# Patient Record
Sex: Male | Born: 1980 | Race: Black or African American | Hispanic: No | Marital: Single | Smoking: Never smoker
Health system: Southern US, Community
[De-identification: ages and names within clinical notes are randomized; demographics above are authoritative.]

## PROBLEM LIST (undated history)

## (undated) DIAGNOSIS — N3944 Nocturnal enuresis: Secondary | ICD-10-CM

## (undated) DIAGNOSIS — Z952 Presence of prosthetic heart valve: Secondary | ICD-10-CM

## (undated) DIAGNOSIS — G919 Hydrocephalus, unspecified: Secondary | ICD-10-CM

## (undated) DIAGNOSIS — R040 Epistaxis: Secondary | ICD-10-CM

## (undated) DIAGNOSIS — R569 Unspecified convulsions: Secondary | ICD-10-CM

## (undated) DIAGNOSIS — E871 Hypo-osmolality and hyponatremia: Secondary | ICD-10-CM

## (undated) DIAGNOSIS — N289 Disorder of kidney and ureter, unspecified: Secondary | ICD-10-CM

## (undated) DIAGNOSIS — I6509 Occlusion and stenosis of unspecified vertebral artery: Secondary | ICD-10-CM

## (undated) DIAGNOSIS — I10 Essential (primary) hypertension: Secondary | ICD-10-CM

## (undated) DIAGNOSIS — R9431 Abnormal electrocardiogram [ECG] [EKG]: Secondary | ICD-10-CM

## (undated) DIAGNOSIS — I639 Cerebral infarction, unspecified: Secondary | ICD-10-CM

## (undated) DIAGNOSIS — M329 Systemic lupus erythematosus, unspecified: Secondary | ICD-10-CM

## (undated) DIAGNOSIS — S069X9A Unspecified intracranial injury with loss of consciousness of unspecified duration, initial encounter: Secondary | ICD-10-CM

## (undated) DIAGNOSIS — Z8669 Personal history of other diseases of the nervous system and sense organs: Secondary | ICD-10-CM

## (undated) DIAGNOSIS — D332 Benign neoplasm of brain, unspecified: Secondary | ICD-10-CM

## (undated) DIAGNOSIS — K7460 Unspecified cirrhosis of liver: Secondary | ICD-10-CM

## (undated) DIAGNOSIS — I8290 Acute embolism and thrombosis of unspecified vein: Secondary | ICD-10-CM

## (undated) DIAGNOSIS — I999 Unspecified disorder of circulatory system: Secondary | ICD-10-CM

## (undated) DIAGNOSIS — Z87898 Personal history of other specified conditions: Secondary | ICD-10-CM

## (undated) DIAGNOSIS — S069XAA Unspecified intracranial injury with loss of consciousness status unknown, initial encounter: Secondary | ICD-10-CM

## (undated) DIAGNOSIS — D689 Coagulation defect, unspecified: Secondary | ICD-10-CM

## (undated) HISTORY — PX: OTHER SURGICAL HISTORY: SHX169

## (undated) HISTORY — PX: DIALYSIS FISTULA CREATION: SHX611

## (undated) HISTORY — PX: BRAIN SURGERY: SHX531

## (undated) HISTORY — PX: CHOLECYSTECTOMY: SHX55

## (undated) HISTORY — PX: CARDIAC SURGERY: SHX584

## (undated) HISTORY — PX: EYE SURGERY: SHX253

---
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
## 2000-09-18 ENCOUNTER — Encounter: Admission: RE | Admit: 2000-09-18 | Discharge: 2000-09-18 | Payer: Self-pay | Admitting: Internal Medicine

## 2000-11-08 ENCOUNTER — Encounter: Admission: RE | Admit: 2000-11-08 | Discharge: 2000-11-08 | Payer: Self-pay | Admitting: Internal Medicine

## 2000-11-26 ENCOUNTER — Encounter: Payer: Self-pay | Admitting: Emergency Medicine

## 2000-11-26 ENCOUNTER — Encounter (INDEPENDENT_AMBULATORY_CARE_PROVIDER_SITE_OTHER): Payer: Self-pay | Admitting: *Deleted

## 2000-11-26 ENCOUNTER — Inpatient Hospital Stay (HOSPITAL_COMMUNITY): Admission: EM | Admit: 2000-11-26 | Discharge: 2000-12-17 | Payer: Self-pay | Admitting: Emergency Medicine

## 2000-11-26 ENCOUNTER — Encounter: Payer: Self-pay | Admitting: Internal Medicine

## 2000-11-26 ENCOUNTER — Encounter (INDEPENDENT_AMBULATORY_CARE_PROVIDER_SITE_OTHER): Payer: Self-pay | Admitting: Specialist

## 2000-11-27 ENCOUNTER — Encounter: Payer: Self-pay | Admitting: Internal Medicine

## 2000-11-27 ENCOUNTER — Encounter: Payer: Self-pay | Admitting: Emergency Medicine

## 2000-11-29 ENCOUNTER — Encounter: Payer: Self-pay | Admitting: Internal Medicine

## 2000-12-01 ENCOUNTER — Encounter: Payer: Self-pay | Admitting: Internal Medicine

## 2000-12-02 ENCOUNTER — Encounter: Payer: Self-pay | Admitting: Internal Medicine

## 2000-12-03 ENCOUNTER — Encounter: Payer: Self-pay | Admitting: Internal Medicine

## 2000-12-07 ENCOUNTER — Encounter: Payer: Self-pay | Admitting: Internal Medicine

## 2000-12-09 ENCOUNTER — Encounter: Payer: Self-pay | Admitting: Internal Medicine

## 2000-12-11 ENCOUNTER — Encounter: Payer: Self-pay | Admitting: Nephrology

## 2000-12-11 ENCOUNTER — Encounter: Payer: Self-pay | Admitting: General Surgery

## 2000-12-12 ENCOUNTER — Encounter: Payer: Self-pay | Admitting: General Surgery

## 2000-12-16 ENCOUNTER — Encounter: Payer: Self-pay | Admitting: Internal Medicine

## 2000-12-24 ENCOUNTER — Encounter (HOSPITAL_COMMUNITY): Admission: RE | Admit: 2000-12-24 | Discharge: 2001-03-24 | Payer: Self-pay | Admitting: Nephrology

## 2000-12-25 ENCOUNTER — Encounter: Payer: Self-pay | Admitting: Internal Medicine

## 2000-12-25 ENCOUNTER — Inpatient Hospital Stay (HOSPITAL_COMMUNITY): Admission: RE | Admit: 2000-12-25 | Discharge: 2001-01-24 | Payer: Self-pay | Admitting: Internal Medicine

## 2000-12-25 ENCOUNTER — Encounter (INDEPENDENT_AMBULATORY_CARE_PROVIDER_SITE_OTHER): Payer: Self-pay | Admitting: Specialist

## 2000-12-25 ENCOUNTER — Encounter: Admission: RE | Admit: 2000-12-25 | Discharge: 2000-12-25 | Payer: Self-pay | Admitting: Internal Medicine

## 2000-12-26 ENCOUNTER — Encounter: Payer: Self-pay | Admitting: Internal Medicine

## 2000-12-31 ENCOUNTER — Encounter: Payer: Self-pay | Admitting: Internal Medicine

## 2001-01-01 ENCOUNTER — Encounter: Payer: Self-pay | Admitting: *Deleted

## 2001-01-04 ENCOUNTER — Encounter: Payer: Self-pay | Admitting: Internal Medicine

## 2001-01-08 ENCOUNTER — Encounter: Payer: Self-pay | Admitting: Internal Medicine

## 2001-01-09 ENCOUNTER — Encounter: Payer: Self-pay | Admitting: Surgery

## 2001-01-10 ENCOUNTER — Encounter: Payer: Self-pay | Admitting: Surgery

## 2001-01-11 ENCOUNTER — Encounter: Payer: Self-pay | Admitting: Cardiothoracic Surgery

## 2001-01-12 ENCOUNTER — Encounter: Payer: Self-pay | Admitting: Cardiothoracic Surgery

## 2001-01-13 ENCOUNTER — Encounter: Payer: Self-pay | Admitting: Cardiothoracic Surgery

## 2001-01-17 ENCOUNTER — Encounter: Payer: Self-pay | Admitting: Surgery

## 2001-01-19 ENCOUNTER — Encounter: Payer: Self-pay | Admitting: *Deleted

## 2001-02-05 ENCOUNTER — Encounter: Admission: RE | Admit: 2001-02-05 | Discharge: 2001-02-05 | Payer: Self-pay | Admitting: Internal Medicine

## 2001-02-20 ENCOUNTER — Inpatient Hospital Stay (HOSPITAL_COMMUNITY): Admission: EM | Admit: 2001-02-20 | Discharge: 2001-02-28 | Payer: Self-pay

## 2001-02-21 ENCOUNTER — Encounter: Payer: Self-pay | Admitting: Internal Medicine

## 2001-02-21 ENCOUNTER — Encounter: Payer: Self-pay | Admitting: Neurosurgery

## 2001-02-23 ENCOUNTER — Encounter: Payer: Self-pay | Admitting: Internal Medicine

## 2001-02-25 ENCOUNTER — Encounter: Payer: Self-pay | Admitting: Neurosurgery

## 2001-02-27 ENCOUNTER — Encounter: Payer: Self-pay | Admitting: Neurosurgery

## 2001-03-12 ENCOUNTER — Encounter: Payer: Self-pay | Admitting: Neurosurgery

## 2001-03-12 ENCOUNTER — Inpatient Hospital Stay (HOSPITAL_COMMUNITY): Admission: EM | Admit: 2001-03-12 | Discharge: 2001-03-31 | Payer: Self-pay

## 2001-03-14 ENCOUNTER — Encounter: Payer: Self-pay | Admitting: Infectious Diseases

## 2001-03-15 ENCOUNTER — Encounter: Payer: Self-pay | Admitting: Infectious Diseases

## 2001-03-15 ENCOUNTER — Encounter: Payer: Self-pay | Admitting: Neurosurgery

## 2001-03-18 ENCOUNTER — Encounter: Payer: Self-pay | Admitting: Infectious Diseases

## 2001-03-21 ENCOUNTER — Encounter: Payer: Self-pay | Admitting: Neurosurgery

## 2001-03-21 ENCOUNTER — Encounter: Payer: Self-pay | Admitting: Infectious Diseases

## 2001-03-24 ENCOUNTER — Encounter: Payer: Self-pay | Admitting: Infectious Diseases

## 2001-03-31 ENCOUNTER — Encounter: Payer: Self-pay | Admitting: Neurosurgery

## 2001-05-19 ENCOUNTER — Encounter: Admission: RE | Admit: 2001-05-19 | Discharge: 2001-05-19 | Payer: Self-pay | Admitting: Internal Medicine

## 2001-07-22 ENCOUNTER — Inpatient Hospital Stay (HOSPITAL_COMMUNITY): Admission: AC | Admit: 2001-07-22 | Discharge: 2001-08-02 | Payer: Self-pay

## 2001-07-22 ENCOUNTER — Encounter: Payer: Self-pay | Admitting: Emergency Medicine

## 2001-07-23 ENCOUNTER — Encounter: Payer: Self-pay | Admitting: Internal Medicine

## 2001-07-24 ENCOUNTER — Encounter: Payer: Self-pay | Admitting: Internal Medicine

## 2001-07-25 ENCOUNTER — Encounter: Payer: Self-pay | Admitting: Neurosurgery

## 2001-07-25 ENCOUNTER — Encounter: Payer: Self-pay | Admitting: Internal Medicine

## 2001-07-29 ENCOUNTER — Encounter: Payer: Self-pay | Admitting: Internal Medicine

## 2001-08-01 ENCOUNTER — Encounter: Payer: Self-pay | Admitting: Neurosurgery

## 2001-10-13 ENCOUNTER — Encounter: Payer: Self-pay | Admitting: Neurosurgery

## 2001-10-13 ENCOUNTER — Encounter: Admission: RE | Admit: 2001-10-13 | Discharge: 2001-10-13 | Payer: Self-pay | Admitting: Neurosurgery

## 2001-11-14 ENCOUNTER — Emergency Department (HOSPITAL_COMMUNITY): Admission: EM | Admit: 2001-11-14 | Discharge: 2001-11-14 | Payer: Self-pay | Admitting: *Deleted

## 2002-02-16 ENCOUNTER — Encounter: Admission: RE | Admit: 2002-02-16 | Discharge: 2002-02-16 | Payer: Self-pay | Admitting: Internal Medicine

## 2002-03-09 ENCOUNTER — Encounter: Admission: RE | Admit: 2002-03-09 | Discharge: 2002-03-09 | Payer: Self-pay | Admitting: Internal Medicine

## 2002-04-13 ENCOUNTER — Encounter: Admission: RE | Admit: 2002-04-13 | Discharge: 2002-04-13 | Payer: Self-pay | Admitting: Internal Medicine

## 2002-04-15 ENCOUNTER — Encounter: Admission: RE | Admit: 2002-04-15 | Discharge: 2002-04-15 | Payer: Self-pay | Admitting: Internal Medicine

## 2002-06-08 ENCOUNTER — Encounter: Admission: RE | Admit: 2002-06-08 | Discharge: 2002-06-08 | Payer: Self-pay | Admitting: Internal Medicine

## 2002-07-07 ENCOUNTER — Encounter: Admission: RE | Admit: 2002-07-07 | Discharge: 2002-07-07 | Payer: Self-pay | Admitting: Internal Medicine

## 2002-07-16 ENCOUNTER — Encounter: Admission: RE | Admit: 2002-07-16 | Discharge: 2002-07-16 | Payer: Self-pay | Admitting: Internal Medicine

## 2003-02-03 ENCOUNTER — Encounter: Admission: RE | Admit: 2003-02-03 | Discharge: 2003-02-03 | Payer: Self-pay | Admitting: Internal Medicine

## 2003-03-04 ENCOUNTER — Encounter: Admission: RE | Admit: 2003-03-04 | Discharge: 2003-03-04 | Payer: Self-pay | Admitting: Internal Medicine

## 2003-10-27 ENCOUNTER — Encounter: Admission: RE | Admit: 2003-10-27 | Discharge: 2003-10-27 | Payer: Self-pay | Admitting: Internal Medicine

## 2003-11-05 ENCOUNTER — Encounter: Admission: RE | Admit: 2003-11-05 | Discharge: 2003-11-05 | Payer: Self-pay | Admitting: Internal Medicine

## 2003-11-18 ENCOUNTER — Encounter: Admission: RE | Admit: 2003-11-18 | Discharge: 2003-11-18 | Payer: Self-pay | Admitting: Internal Medicine

## 2004-09-28 ENCOUNTER — Ambulatory Visit: Payer: Self-pay | Admitting: Internal Medicine

## 2004-09-28 ENCOUNTER — Inpatient Hospital Stay (HOSPITAL_COMMUNITY): Admission: EM | Admit: 2004-09-28 | Discharge: 2004-10-05 | Payer: Self-pay | Admitting: Emergency Medicine

## 2005-03-30 ENCOUNTER — Encounter (HOSPITAL_COMMUNITY): Admission: RE | Admit: 2005-03-30 | Discharge: 2005-06-28 | Payer: Self-pay | Admitting: Nephrology

## 2005-10-29 ENCOUNTER — Ambulatory Visit (HOSPITAL_COMMUNITY): Admission: RE | Admit: 2005-10-29 | Discharge: 2005-10-29 | Payer: Self-pay | Admitting: Neurosurgery

## 2005-11-20 ENCOUNTER — Ambulatory Visit (HOSPITAL_COMMUNITY): Admission: RE | Admit: 2005-11-20 | Discharge: 2005-11-20 | Payer: Self-pay | Admitting: Internal Medicine

## 2005-11-27 ENCOUNTER — Ambulatory Visit (HOSPITAL_COMMUNITY): Payer: Self-pay | Admitting: Psychiatry

## 2006-08-03 ENCOUNTER — Inpatient Hospital Stay (HOSPITAL_COMMUNITY): Admission: EM | Admit: 2006-08-03 | Discharge: 2006-08-05 | Payer: Self-pay | Admitting: Emergency Medicine

## 2007-03-14 ENCOUNTER — Encounter (HOSPITAL_COMMUNITY): Admission: RE | Admit: 2007-03-14 | Discharge: 2007-05-28 | Payer: Self-pay | Admitting: Nephrology

## 2008-06-09 ENCOUNTER — Emergency Department (HOSPITAL_COMMUNITY): Admission: EM | Admit: 2008-06-09 | Discharge: 2008-06-09 | Payer: Self-pay | Admitting: Emergency Medicine

## 2008-06-14 ENCOUNTER — Emergency Department (HOSPITAL_COMMUNITY): Admission: EM | Admit: 2008-06-14 | Discharge: 2008-06-14 | Payer: Self-pay | Admitting: Emergency Medicine

## 2008-07-29 ENCOUNTER — Encounter (HOSPITAL_COMMUNITY): Admission: RE | Admit: 2008-07-29 | Discharge: 2008-10-27 | Payer: Self-pay | Admitting: Nephrology

## 2008-11-10 ENCOUNTER — Emergency Department (HOSPITAL_COMMUNITY): Admission: EM | Admit: 2008-11-10 | Discharge: 2008-11-10 | Payer: Self-pay | Admitting: Emergency Medicine

## 2009-06-25 ENCOUNTER — Emergency Department (HOSPITAL_COMMUNITY): Admission: EM | Admit: 2009-06-25 | Discharge: 2009-06-25 | Payer: Self-pay | Admitting: Emergency Medicine

## 2010-07-16 ENCOUNTER — Encounter: Payer: Self-pay | Admitting: Neurosurgery

## 2010-10-04 LAB — IRON AND TIBC
Saturation Ratios: 57 % — ABNORMAL HIGH (ref 20–55)
TIBC: 205 ug/dL — ABNORMAL LOW (ref 215–435)
UIBC: 88 ug/dL

## 2010-10-05 LAB — IRON AND TIBC: Saturation Ratios: 43 % (ref 20–55)

## 2010-10-09 IMAGING — CR DG HAND COMPLETE 3+V*L*
3 series · 3 of 3 positions shown · non-contrast
Comparison: None

CLINICAL DATA: Fall.  Posterior pain and swelling.

LEFT HAND - COMPLETE 3+ VIEW

[view not recorded (1 of 3)]
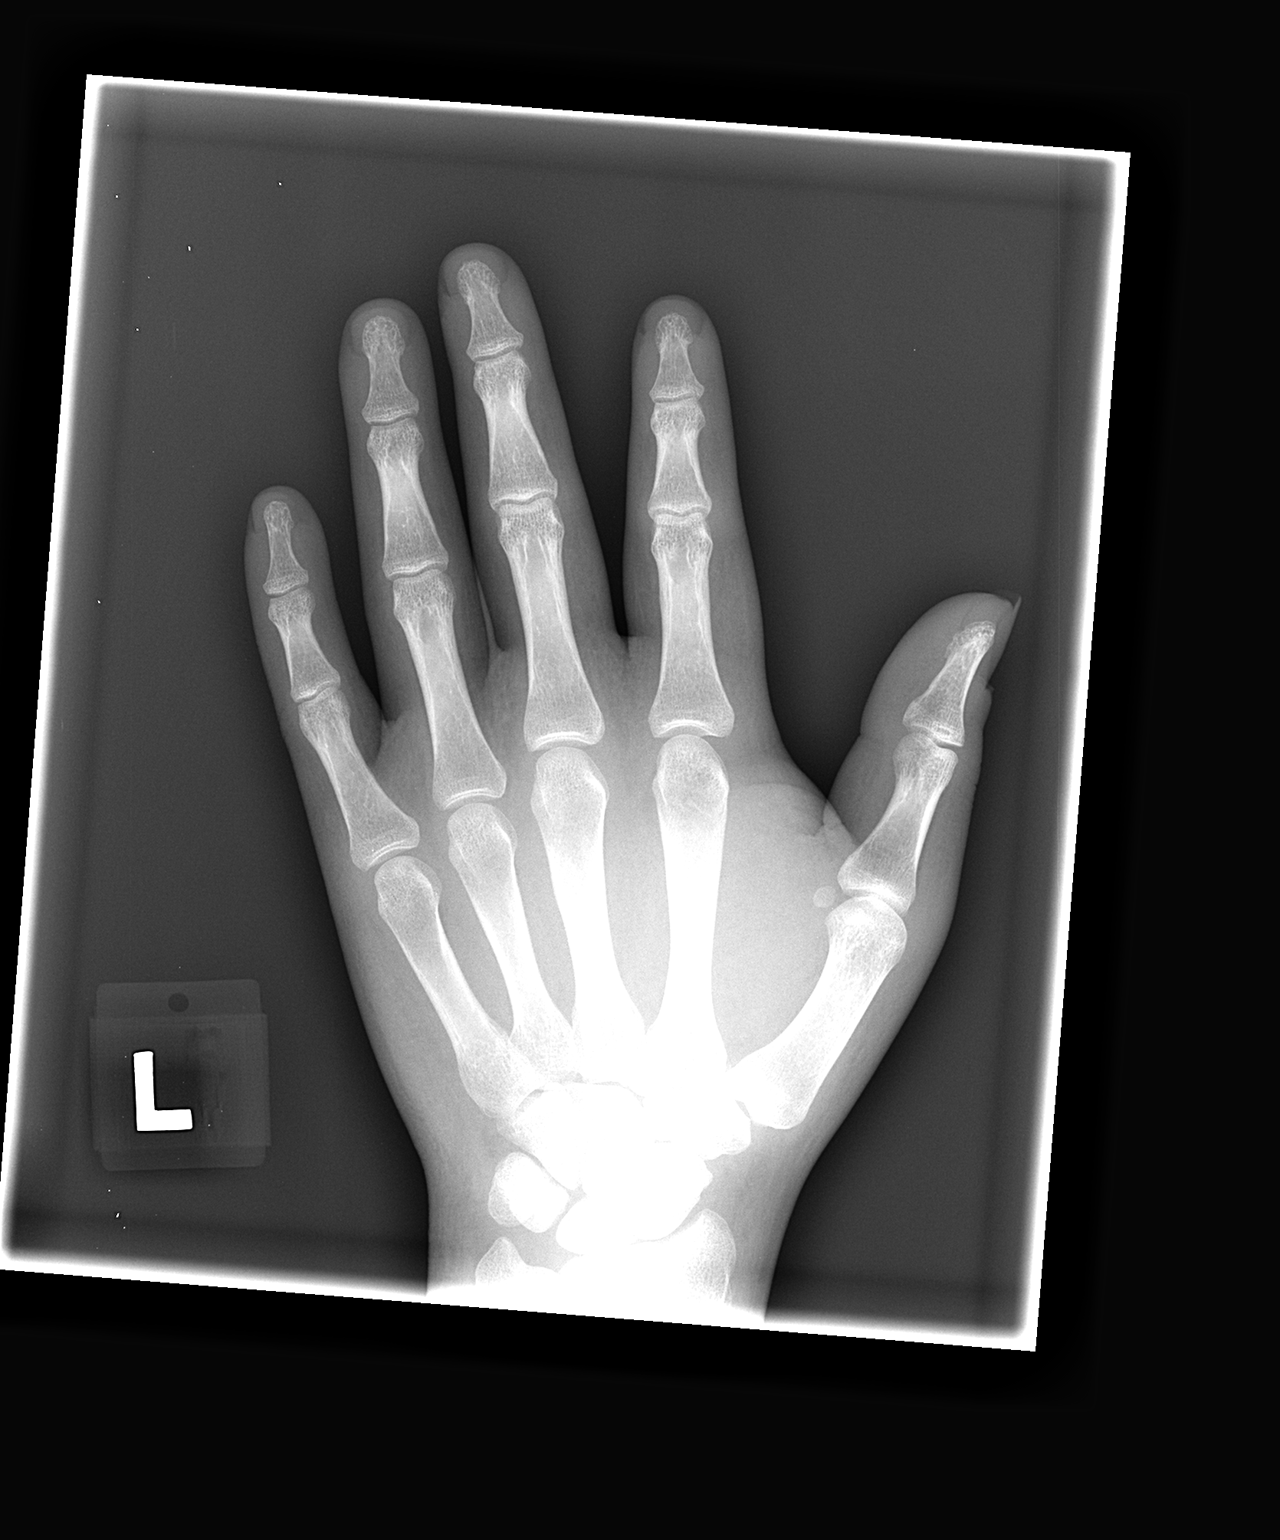

[view not recorded (2 of 3)]
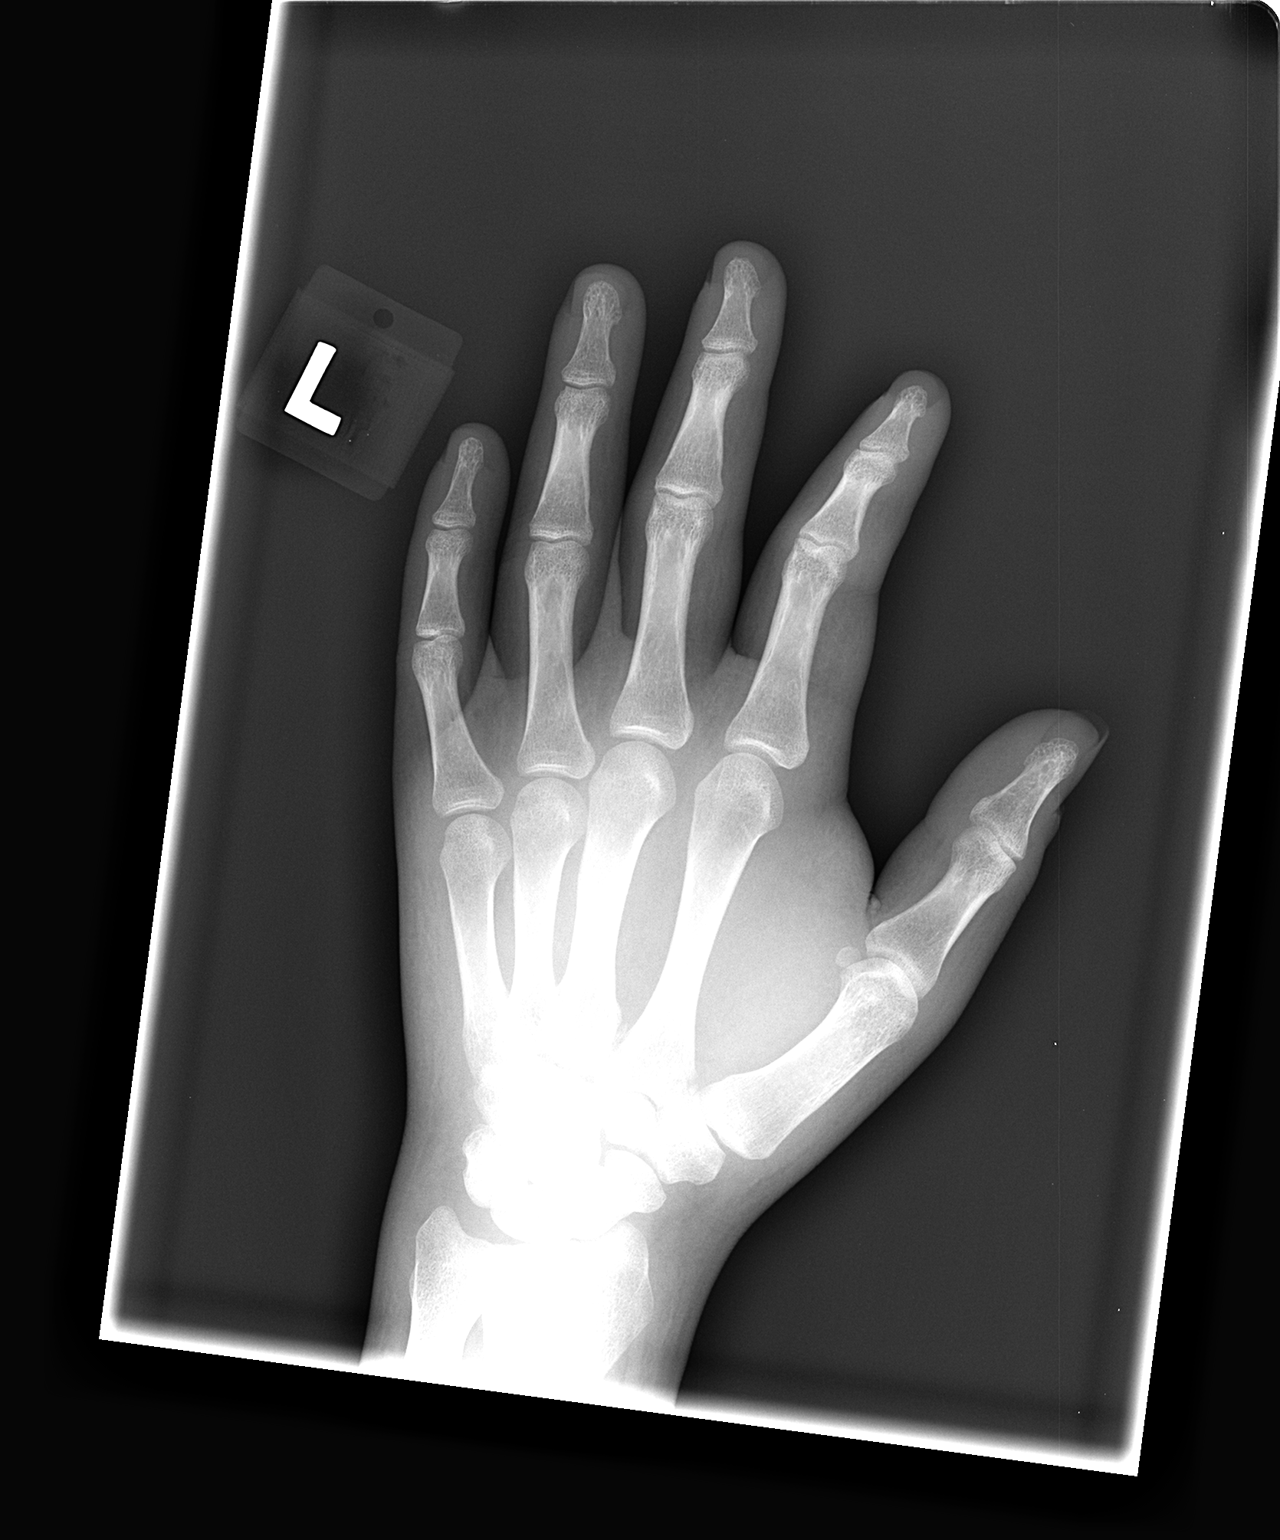

[view not recorded (3 of 3)]
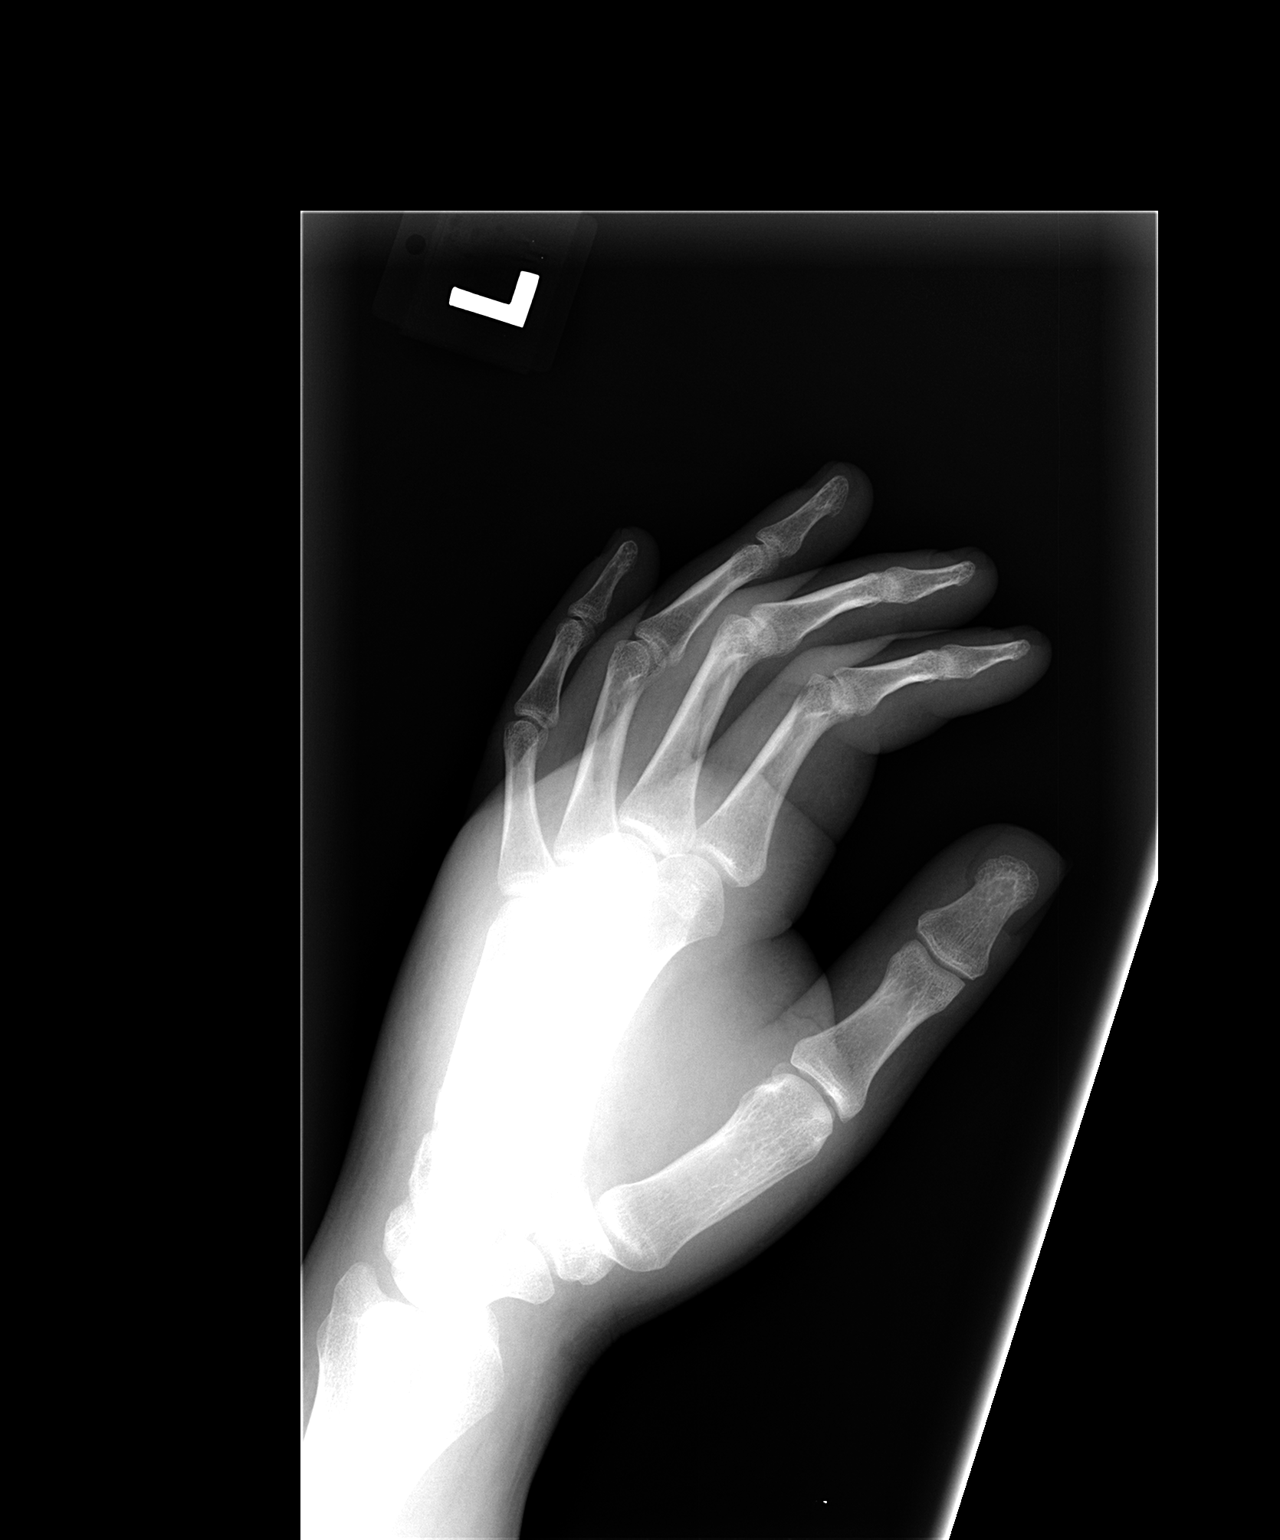

[3 of 3 positions shown; findings below may reference images not displayed]

FINDINGS: No lateral view is performed.  2 oblique images were
performed.  Given this factor, no acute fracture or dislocation.
There is soft tissue swelling about the dorsum of the hand at the
MCP level.
IMPRESSION: 1.  Lack of true lateral view limits this exam.
2.  Soft tissue swelling dorsally without acute fracture or
dislocation.

## 2010-10-10 LAB — IRON AND TIBC
Iron: 101 ug/dL (ref 42–135)
Saturation Ratios: 48 % (ref 20–55)
UIBC: 108 ug/dL

## 2010-10-10 LAB — POCT HEMOGLOBIN-HEMACUE: Hemoglobin: 11.4 g/dL — ABNORMAL LOW (ref 13.0–17.0)
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
## 2010-11-10 NOTE — Consult Note (Signed)
[place]  Patient:    Cody Simon, Cody Simon                       MRN: [id number] Proc. Date: 12/09/00 Adm. Date:  [id number] Attending:  Madaline Guthrie CC:         Madaline Guthrie, M.D.  James L. Deterding, M.D.  Clydene Fake, M.D.   Consultation Report  REQUESTING PHYSICIAN:  Madaline Guthrie, M.D.  REASON FOR CONSULTATION:  Acute cholecystitis.  HISTORY OF PRESENT ILLNESS:  Cody Simon is a 30 year old male with a significant past medical history.  He presented to the emergency department noticing increasing abdominal girth, lower extremity edema, and facial edema.  He was admitted with the diagnosis of worsening symptoms of his nephrotic syndrome. He has a known VP shunt and this was felt to be functioning.  He did have a few renal insufficiency at the time.  Echocardiogram performed demonstrated a very large pericardial effusion.  A cardiology consultation was obtained and the effusion was drained and right ventricular function has improved since that time.  Over the past two days, he has noticed increasing right upper quadrant pain and has had some nausea and vomiting.  Liver function tests were also noted to be elevated.  Ultrasound of the right upper quadrant was performed, which demonstrated a thickened gallbladder wall with sludge. Findings were consistent with acute cholecystitis.  I was asked to see him because of this.  He states that in the past, sometimes after eating, he gets a pressure-type sensation in the right upper quadrant that would resolve spontaneously.   This was sporadic.  PAST MEDICAL HISTORY: 1. Nephrotic syndrome, membranous nephropathy. 2. Systemic lupus erythematosus contributing to #1. 3. Hypertension. 4. Astrocytoma. 5. Postoperative panhyperpituitarism. 6. Chronic anemia.  PAST SURGICAL HISTORY: 1. Craniotomy and resection of astrocytoma. 2. VP shunt x 2. 3. Kidney biopsy. 4. Reconstructive surgery to his  eye.  ALLERGIES:  None reported.  CURRENT INPATIENT MEDICATIONS:  As listed on his medication sheet.  SOCIAL HISTORY:  He has graduated from high school.  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  He had lived mostly with his grandmother but then has tried to stay somewhat with his mother.  He currently lives with an aunt.  PHYSICAL EXAMINATION:  GENERAL:  A weak-appearing male, very pleasant and cooperative.  VITAL SIGNS:  He is afebrile.  HEENT:  His sclerae and conjunctivae are not icteric.  ABDOMEN:  Soft with some small upper abdominal scars.  He has right upper quadrant tenderness and guarding to palpation.  No palpable masses noted.  LABORATORY DATA:  Remarkable for elevation of his SGOT, SGPT, alkaline phosphatase and elevation of his white blood cell count which was 19,500 yesterday but is 15,500 today.  IMPRESSION:  Right upper quadrant pain with ultrasonographic and laboratory findings consistent with acute cholecystitis.  Physical exam fits this picture as well.  This is somewhat complicated by the fact that has a ventriculoperitoneal shunt which is working.  There is risk whether the gallbladder is removed operatively or medical treatment is started of infection of the shunt.  RECOMMENDATIONS:  I spoke with the patient and grandmother.  After speaking with Dr. Rogelia Boga and noting that his kidney function had improved and he had stable cardiac function on a repeat echocardiogram now, I told them I thought cholecystectomy was indicated.  I did explain to them the risks of the cholecystectomy including but not limited to bleeding, infection,  bile leakage, intestinal damage, and the risk of infection of the shunt.  I also explained to them the possibility of just trying medical therapy but that he potentially will have recurrence of the cholecystitis and/or could have infection of the shunt leading to a meningitis situation.  His grandmother would like to  speak with the primary physician, as well as the nephrologist and the cardiologist.  I will await the familys decision on this.  I did talk with Dr. Rogelia Boga about the recommendations including IV antibiotics which he was going to start. DD:  12/09/00 TD:  12/09/00 Job: [id number]

## 2010-11-10 NOTE — Consult Note (Signed)
[place]  Patient:    Cody Simon, Cody Simon                       MRN: [id number] Adm. Date:  [id number] Attending:  Farley Ly DictatorNonnie Done                          Consultation Report  DATE OF BIRTH:  [birth year]  HISTORY OF PRESENT ILLNESS:  A 29 year old African-American male with history of SLE, history of astrocytoma status post shunt with revision in fall of 2001, hypopituitarism secondary to surgery recently admitted on June 4 and had cholecystectomy and pericardial centesis done during that admission and was discharged on December 17, 2000.  Patient was admitted this time on December 25, 2000 with complaints of abdominal pain, nausea, and vomiting.  Patient was found to have spontaneous subcapsular liver hematoma and pericardial effusion. Drainage of the pericardial effusion has been held off by the cardiologist until he is clinically stable.  Patient had acute renal failure during this hospitalization with oliguria and increased creatinine which is resolved now. Patient has had history of hypertension and has been treated with Catapres patch, labetalol IV p.r.n. during this hospitalization and is on labetalol drip at present.  Patient was given an ACE inhibitor for a short while and was discontinued later.  Renal consult was obtained for management of hypertension.  PAST MEDICAL HISTORY: 1. ______ astrocytoma in August 1991 status post surgery and multiple shunts.    Last shunt was in fall of 2001. 2. 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 3. SLE with history of nephrotic syndrome.  Kidney biopsy in June 2001    revealed ______ membranous and diffuse lupus glomerulonephritis class 5    and 4 B. 4. Status post cholecystectomy in June 2002. 5. Status post pericardial centesis in June 2002 secondary to SLE. 6. Iron deficiency anemia.  MEDICATIONS: 1. Catapres 0.3 mg q.24h. patch q.7 days.  Patient was started on a second    patch  today. 2. Labetalol IV 5.5 mcg/minute. 3. Lasix 20 mg IV given today. 4. Morphine. 5. Dulcolax. 6. Phenergan. 7. Tylenol.  SOCIAL HISTORY:  Patient lives in [place] with his aunt.  He is a Engineer, agricultural.  ALLERGIES:  NKDA.  REVIEW OF SYSTEMS:  Patient denies fever, chills.  Patient complains of pain in his abdomen which is currently controlled with medications.  No chest pain. No shortness of breath.  Patient had been constipated over the past few days and had two bowel movements yesterday.  NEUROLOGIC:  No weakness, paraesthesias, seizures.  PHYSICAL EXAMINATION  GENERAL:  Patient is sleepy, in no acute distress.  Patient is awake, alert, responds to commands.  VITAL SIGNS:  Temperature 96.8, pulse 80, blood pressure 160-190/100-120, pulse ox 98% on room air.  Is and Os 1670/1100.  There is a balance of +570 over the past eight hours.  HEENT:  Normocephalic, atraumatic.  Pupils are equal, round and reactive to light and accommodation.  No conjunctival pallor.  Mucous membranes are moist.  LUNGS:  Clear to auscultation bilaterally.  HEART:  Regular rate and rhythm.  No murmurs, rubs, or gallops.  ABDOMEN:  Tenderness present in the right upper quadrant and left upper quadrant.  No rebound, tenderness.  Bowel sounds are present.  EXTREMITIES:  No edema.  LABORATORIES:  Sodium 120, repeat sodium 126, potassium 3.6, chloride 92, bicarbonate 22, BUN 29,  creatinine 1.2, glucose 375, bilirubin 3.1.  Urine sodium 52.  Bleeding time 15.  Urine bilirubin is negative.  UA showed 0-2 wbcs, 0-2 rbcs, many bacteria.  Culture is Gram negative rods Enterococcus. DIC panel showed platelets 139,000, PT 12.7, INR 0.9, PTT 32, fibrinogen 524, D-dimer of less than 0.22.  ASSESSMENT AND PLAN: 1. Hypertension probably secondary to renal parenchymal disease.  Patient is    on clonidine patch 0.3 mg q.hour and currently has two patches of clonidine    and labetalol drip at 5.5  mcg.  Will start Norvasc.  Will discontinue NG    and start Norvasc 10 mg p.o. and follow.  Will continue labetalol drip for    now.  Will change to p.o. labetalol in the next one to two days when he is    able to tolerate p.o.  Will continue Lasix 20 mg intravenous t.i.d. 2. Lupus glomerulonephritis.  Patient is on steroids. 3. Hyponatremia.  Patient is currently on D5 intravenous fluids.  Will change    medications and administer the medications with normal saline and follow    ______. 4. Liver hematoma followed by surgery. 5. Pericardial effusion followed by cardiology.  Plan on pericardial centesis    when patient is more stable. DD:  01/01/01 TD:  01/02/01 Job: [id number]

## 2010-11-10 NOTE — Discharge Summary (Signed)
NAMEJERRION, TABBERT                ACCOUNT NO.:  [id number]   MEDICAL RECORD NO.:  [id number]          PATIENT TYPE:  INP   LOCATION:  5032                         FACILITY:  [facility]   PHYSICIAN:  Ellie Lunch, M.D.      DATE OF BIRTH:  05-Feb-1981   DATE OF ADMISSION:  09/27/2004  DATE OF DISCHARGE:  10/05/2004                                 DISCHARGE SUMMARY   ADDENDUM:  Please note that the patient's free Dilantin level is pending on  the day of discharge and the levels came back on April 11 and they were less  than 1.  His restaurant was called and his Dilantin dose was increased from  100 mg p.o. b.i.d. to 100 p.o. t.i.d. starting April 20.  He has been  scheduled to come to the outpatient clinic for a Dilantin level as well as  an albumin level on October 18, 2004 to titrate his dose accordingly.      BP/MEDQ  D:  10/12/2004  T:  10/12/2004  Job:  [id number]   cc:   Fayrene Fearing L. Deterding, M.D.  [address]  [place]  [place] [address]  Fax: [phone]

## 2010-11-10 NOTE — H&P (Signed)
NAMEMARWIN, Cody Simon                ACCOUNT NO.:  [id number]   MEDICAL RECORD NO.:  [id number]          PATIENT TYPE:  EMS   LOCATION:  MAJO                         FACILITY:  [facility]   PHYSICIAN:  Mobolaji B. Bakare, M.D.DATE OF BIRTH:  [birth year]   DATE OF ADMISSION:  08/02/2006  DATE OF DISCHARGE:                              HISTORY & PHYSICAL   PRIMARY CARE PHYSICIAN:  Dr. Felecia Shelling at [place].   NEPHROLOGIST:  Dr. Darrick Penna.   CHIEF COMPLAINT:  Low hemoglobin and high Dilantin level.   HISTORY OF THE PRESENTING COMPLAINT:  Cody Simon is an unfortunate 30-year-  old African American male with history of lupus, pineal astrocytoma, is  status post radiation treatment and VP shunt.  He has a seizure disorder  and chronic kidney disease.  He is being followed up by [employer].  The patient went for his routine followup today and some  labs were requested.  Results came back showing severe anemia with  hemoglobin of 5.5 and hematocrit 16.1.  Previous hemoglobin and  hematocrit on the 12th of December 2007 were 11.2/33.3.  The patient at  that point had stopped Aranesp.  There is no history of melenic stool or  hematemesis.  There is not much history obtainable from the patient  because he was drowsy and unable to give adequate history.  The patient  does have severe behavioral problems with some mental retardation.  He  lives in a group home setting.   In addition, he has Dilantin level of 30.5.  His creatinine had  deteriorated from 2.75 in December 2007 to 4.1 today.   The patient is currently receiving 1 unit of packed red blood cells.   REVIEW OF SYSTEMS:  Unobtainable.   PAST MEDICAL HISTORY:  1. 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  2. Severe hypertension.  3. Seizure disorder.  4. Pineal astrocytoma, status post radiation therapy and VP shunt.  5. Pericardial effusion secondary to lupus, status post      pericardiocentesis in June 2002.  6.  Secondary hyperparathyroidism.  7. Behavioral problems with mental retardation.  8. Organic brain syndrome.  9. Multiple revisions of VP shunt.  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  11.Cholecystectomy.  12.Hypothyroidism.   CURRENT MEDICATIONS:  1. Prednisone 10 mg daily.  2. Calcitriol 0.5 mcg two tablets daily.  3. Lisinopril 20 mg at bedtime.  4. Omeprazole.  5. Renovite once a day.  6. Dilantin 300 mg in a.m. and 200 mg in p.m.  7. Minoxidil 2.5 mg daily (THIS WAS DISCONTINUED TODAY).  8. Synthroid 0.5 mg daily.  9. Lasix 80 mg b.i.d. (THIS WAS DISCONTINUED TODAY).  10.Hydrocodone.  11.CellCept 500 mg b.i.d.  12.Tylenol p.r.n.  13.Invega 6 mg daily.  14.Monistat cream b.i.d.  15.Rozerem 8 mg nightly p.r.n.   ALLERGIES:  No known drug allergies.   FAMILY HISTORY:  Unobtainable.   SOCIAL HISTORY:  The patient resides in a group home.   PHYSICAL EXAMINATION:  INITIAL VITALS:  Temperature 98.5, blood pressure  104/59, pulse of 100, respiratory rate of 18, O2 SATs of 97%.  GENERAL:  The patient is  drowsy, falls back to sleep easily.  HEENT:  Normocephalic.  Pupils are equal and reactive to light.  Mucous  membranes moist.  NECK:  No elevated JVD.  No carotid bruit.  Not that the patient has a  surgical scar in the cervical region.  LUNGS:  Clear clinically to auscultation.  CV:  S1 and S2, regular, with short systolic murmur.  ABDOMEN:  Not distended, soft, nontender.  Bowel sounds present.  RECTAL:  No melenic stool noted, stool sent for Hemoccult.  EXTREMITIES:  No pedal edema or calf tenderness.  Dorsalis pedis pulses  are palpable.  CNS:  No focal neurological deficits.   LABORATORY DATA:  Occult blood negative.  Creatinine 4.1.  Sodium 124,  potassium 5.3, chloride 97, glucose 97, BUN 106, bicarb 19.5.  Hemoglobin 6.5, hematocrit 19.  Venous pH 7.31.  Dilantin level 44.6.  White cell count 6.4, platelets 292,000, MCV 94.8.   ASSESSMENT AND PLAN:  Cody Simon is a 30 year old  African American male  presenting with severe anemia and Dilantin toxicity.  He has  deterioration in the chronic kidney disease with creatinine of 4.1.  The  patient has a history of lupus nephritis.   ADMISSION DIAGNOSES:  1. Severe anemia:  This is probably related to chronic kidney disease;      however, the acuity of change in hemoglobin is concerning for blood      loss, but there is no obvious source of bleeding.  Stool Hemoccult      is negative.  The patient will be transfused gently with 2 units of      packed red blood cells and reassessed thereafter.  Anemia panel      will be checked.  2. Chronic kidney disease:  There is rapid deterioration over the last      2 months.  We will obtain Nephrology consult in the morning.  Will      treat mild hyperkalemia with Kayexalate 15 mg p.o. x1 now, check      urinalysis and microscopy, obtain renal panel in the morning.  3. Systemic lupus:  We will continue CellCept and prednisone.  I      suspect he may have a flare-up of lupus activity.  We will check      sedimentation rate, C-reactive protein, CH50, C3 and C4.  4. Secondary hyperparathyroidism.  5. Dilantin toxicity:  We will hold Dilantin and monitor level.  6. Seizure disorder:  We need to adjust renal-dose Dilantin when      Dilantin level normalizes.  7. Altered mental status:  The patient is too drowsy.  He does have a      ventriculoperitoneal shunt.  We will check head CT scan to rule out      hydrocephalus.  The altered mentation may be due to sedating      medications.  Patient to be off Rozerem.  8. Panhypopituitarism:  We will check thyroid-stimulating hormone,      resume Synthroid.  9. Hyponatremia.      Mobolaji B. Corky Downs, M.D.  Electronically Signed     MBB/MEDQ  D:  08/03/2006  T:  08/03/2006  Job:  [id number]   cc:   Dr. Luberta Mutter D. Felecia Shelling, MD

## 2010-11-10 NOTE — Discharge Summary (Signed)
[place]  Patient:    MEHTAB, DOLBERRY Visit Number: [id number] MRN: [id number]          Service Type: MED Location: [phone] Attending Physician:  Madaline Guthrie Dictated by:   Dory Peru, Admit Date:  03/12/2001 Discharge Date: 03/31/2001                             Discharge Summary  ADDENDUM  FOLLOWUP OF PENDING LABORATORIES:  Plasma catecholamines were unable to be quantitated due to an interfering substance in the patients blood.  However, urine metanephrine levels showed a normal level of metanephrine.  Urine 24 hour volume was 1675, metanephrine was 104, reference range 30 to 350. Normetanephrine was 111, reference range 50 to 650.  Creatinine was 45.  Urine creatinine was 754.  ______ acid BMA was 1.5, with reference range of 0 to 7.0.  Urine 5HA was 2, with a reference range of 0 to 15.2. Dictated by:   Dory Peru, Attending Physician:  Madaline Guthrie DD:  04/11/01 TD:  04/14/01 Job: [id number]

## 2010-11-10 NOTE — Op Note (Signed)
. [facility]  Patient:    Cody Simon, Cody Simon                       MRN: [id number] Proc. Date: 01/09/01 Adm. Date:  [id number] Attending:  Cleatrice Burke                           Operative Report  PREOPERATIVE DIAGNOSIS:  Recurrent pericarditis with large pericardial effusion.  POSTOPERATIVE DIAGNOSIS:  Recurrent pericarditis with large pericardial effusion.  PROCEDURES:  Median sternotomy, radical pericardiectomy.  SURGEON:  Alleen Borne, M.D.  ASSISTANTS:  Areta Haber, P.A., and Sherrie George, P.A.  ANESTHESIA:  General endotracheal.  CLINICAL HISTORY:  This patient is a 30 year old gentleman with a history of lupus, who has had recurrent pericarditis.  On the recent admission prior to this admission, he was diagnosed with a large pericardial effusion that was treated with pericardiocentesis by cardiology.  He also was diagnosed with acute cholecystitis and underwent a laparoscopic cholecystectomy.  This was complicated by postoperative bleeding.  He went home but was readmitted with several days of nausea, vomiting, and poor appetite as well as abdominal pain. CT scan of the abdomen showed a large subcapsular liver hematoma.  This also showed a large pericardial effusion.  He was noted on physical examination to have a pulsus paradoxus of 40-44 mmHg.  He remained hemodynamically stable. We were consulted to see him.  Since he was admitted with large subcapsular liver hematoma, we thought it would be best to allow him to recover from that and be sure that that is stable before proceeding with drainage of his pericardial effusion.  Given his history of lupus and recurrent pericarditis and pericardial effusion and the appearance that this was an organized effusion on CT scan, we felt that it would be best to perform complete pericardiectomy to prevent recurrence and complications of constrictive pericarditis.  I discussed the  operative procedure with the patient and his aunt, including alternatives, benefits, and risks, including bleeding, possible blood transfusion, infection, injury to the heart or great vessels, recurrence of the effusion, and death.  They understood and agreed to proceed.  DESCRIPTION OF PROCEDURE:  The patient was taken to the operating room and placed on the table in supine position.  After induction of general endotracheal anesthesia, a Foley catheter was placed in the bladder using sterile technique.  Then the chest, abdomen, and both groins were prepped with Betadine soap and solution and draped in the usual sterile manner.  The chest was entered through a median sternotomy incision.  The pericardium was opened in the midline.  There was a marked fibrinous pericarditis with organization of the large pericardial effusion.  Both the visceral and parietal pericardium were markedly thickened and adherent to each other and to the heart.  There was no free fluid or blood present within the pericardium.  Then the parietal pericardium was completely freed from the heart.  This took a considerable amount of time due to the density of the adhesions.  Then the visceral pericardium was completely removed from the heart.  This was very thick and acutely inflamed.  There was no injury to the heart and minimal bleeding. Then the parietal pericardium was excised laterally down to the phrenic nerve on each side.  The diaphragmatic pericardium was excised down to the inferior vena cava and posteriorly to the pulmonary vein.  The phrenic nerves were  carefully preserved.  There was complete hemostasis.  Then four chest tubes were placed with bilateral pleural tubes, a tube inferiorly over the diaphragm within the mediastinum and one in the anterior mediastinum.  Then the sternum was reapproximated with #6 stainless steel wires.  The fascia was closed wit a continuous #1 Vicryl suture.  The subcutaneous  tissue was closed with a continuous 2-0 Vicryl and the skin with 3-0 Vicryl subcuticular skin closure. The sponge, needle, and instruments were correct according to the scrub nurse. Dry sterile dressings were applied over the incision and around the chest tubes, which were hooked to Pleuravac suction.  The patient was then transported to the surgical intensive care unit in guarded but stable condition. DD:  01/09/01 TD:  01/10/01 Job: [id number]

## 2010-11-10 NOTE — Consult Note (Signed)
[place]  Patient:    Cody Simon, Cody Simon                       MRN: [id number] Proc. Date: 01/20/01 Adm. Date:  [id number] Attending:  Cleatrice Burke CC:         Farley Ly, M.D.   Consultation Report  HISTORY OF PRESENT ILLNESS:  Cody Simon is a 30 year old right-handed black male born [birth year] with a history of lupus who presents following a cholecystectomy.  The patient was admitted with a known pericardial effusion status post pericardiocentesis.  Patient was noted to have some nausea, vomiting, abdominal pain.  Workup during this hospitalization showed evidence of a pericapsular hemorrhage around the liver.  Patient was followed by general surgery and cardiology during this admission.  This patient was noted to have an enlarging pericardial effusion.  This was surgically managed through CVTS during this admission.  Patient, however, was noted to have episodes of altered mental status with a question of seizures occurring. Patient was noted to have hard tremors and would not respond well to the examiner.  Since that time patient has been in and out of episodes of unresponsiveness without jerking.  A CT scan of the head was performed and shows evidence of a VP shunt in place.  There is evidence of a low density in the posterior fossa on the left consistent with an encephalomalacia but no acute changes were seen.  Patient has been placed on Dilantin.  Continues to have episodes of unresponsiveness.  Neurology was called for an evaluation.  PAST MEDICAL HISTORY:  1. Episodes of altered mental status, rule out seizures.  Patient has a prior     history of seizures around the time of a diagnosis of a ______     astrocytoma.  2. ______ astrocytoma resected August 1991.  Patient has been off seizure     medications since 1993, has done well.  3. History of pericarditis and pericardial effusion status post pericardial  stripping.  4. History of lupus.  5. History of hypertension.  6. History of VP shunt placement.  7. History of nephrotic syndrome.  8. History of hypopituitarism.  9. Status post gallbladder resection. 10. Capsular hematoma of the liver following gallbladder resection.  ALLERGIES:  No known drug allergies.  SOCIAL HISTORY:  Does not smoke or drink.  This patient lives in the [place] area.  Patient lives with family.  Patient has no children. Patient is not employed.  CURRENT MEDICATIONS:  1. Dulcolax 10 mg q.d.  2. Norvasc 10 mg q.d.  3. Protonix 40 mg q.d.  4. Synthroid 0.1 mg q.d.  5. Aldactone 25 mg q.d.  6. Ferrous gluconate 300 mg b.i.d.  7. Cardura 4 mg b.i.d.  8. Prednisone 60 mg q.d.  9. Labetalol 600 mg b.i.d. 10. Erythropoietin 10,000 units monthly. 11. Lasix 80 mg q.12h. 12. Dilantin 200 mg b.i.d. 13. Tylox p.r.n. 14. Phenergan p.r.n. 15. Tylenol p.r.n.  REVIEW OF SYSTEMS:  Difficult to obtain but patient appears to complain of some dizziness, headaches off and on.  Denies any visual changes.  Has some occasional nausea, vomiting, abdominal pain.  Denies any numbness or weakness in the arms or legs.  FAMILY HISTORY:  Not obtained.  PHYSICAL EXAMINATION  VITAL SIGNS:  Blood pressure 125/75, heart rate 95, respiratory rate 17, temperature 99.2.  GENERAL:  This patient is a fairly well-developed black male who is alert  at the time of examination.  HEENT:  Head is atraumatic.  Eyes:  Pupils are equal, round and reactive to light.  Disks are flat bilaterally.  NECK:  Supple no carotid bruits noted.  RESPIRATORY:  Clear.  CARDIOVASCULAR:  Distant heart sounds.  No obvious murmur noted.  EXTREMITIES:  Without significant edema.  NEUROLOGIC:  Cranial nerves as above.  Facial symmetry is present.  Patient notes good pin prick sensation bilaterally.  Visual fields are full.  Speech is well enunciated.  Patient has good strength in all four extremities.   Good symmetric motor tone is noted throughout.  Sensory testing is intact to pin prick, soft touch, vibratory sensation throughout.  Patient has good finger-nose-finger, toe-to-finger bilaterally.  Patient was not ambulated. Deep tendon reflexes were symmetric.  Toes neutral bilaterally.  LABORATORIES:  Notable for white count 13.0, hemoglobin 9.2, hematocrit 26.9, platelets 187,000.  Sodium 135, potassium 3.9, chloride 101, CO2 26, glucose 125, BUN 34, creatinine 1.1, albumin 2.1, calcium 7.4, phosphorous 3.8, magnesium 1.4.  Dilantin 14.6.  CT of the head is as above.  IMPRESSION: 1. History of lupus. 2. History of pericardial effusion status post pericardial stripping. 3. History of seizures with possible recurrence. 4. History of hypertension.  Patient has episodes of unresponsiveness that could potentially represent seizures.  Occasionally some left facial twitching is noted but these events could be psychogenic as well.  Will pursue further workup at this point.  PLAN: 1. EEG study. 2. Continue Dilantin for now.  Would obtain free and total levels.  Consider    an MRI scan of the brain at some point in the future if possible and will    follow patients clinical examination. DD:  01/20/01 TD:  01/20/01 Job: [id number]

## 2010-11-10 NOTE — Consult Note (Signed)
[place]  Patient:    Cody Simon, Cody Simon                       MRN: [id number] Proc. Date: 11/29/00 Adm. Date:  [id number] Attending:  Madaline Guthrie CC:         Madaline Guthrie, M.D.   Consultation Report  REASON FOR CONSULTATION:  Evaluation of ventriculoperitoneal shunt.  HISTORY OF PRESENT ILLNESS:  The patient is a 30 year old gentleman who presented June 4 with progressive edema.  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  He was found to have a fluid overload with pulmonary edema and even a pericardial effusion, which he underwent pericardiocentesis.  He has a history of a pineal astrocytoma resected from the posterior fossa in 1991 with subsequent VP shunt placement at [facility].  He had a shunt revision at [facility] in 2001 and the question is if some of this nausea and vomiting that he had been having over the last few days and intermittent headache is due to his multiple medical problems or to VP shunt failure.  He complains of no headache now and has done better with nausea and vomiting today.  He kept some food down, which is better than yesterday, according to his grandmother.  PAST MEDICAL HISTORY:  As above; lupus, hydrocephalus, and nephrotic syndrome.  HOME MEDICATIONS: 1. Cortef. 2. Synthroid.  ALLERGIES:  No known drug allergies.  PAST SURGICAL HISTORY:  As mentioned above.  REVIEW OF SYSTEMS:  He has moved in with his grandmother here in [place] back at the end of January and since then has been lethargic and lack of energy.  He has some intermittent headaches but Tylenol usually makes them go away and he has not had any major headache problems and has not changed mental status throughout this period of time.  He has been having shortness of breath, which is part of the reason for his admission.  SOCIAL HISTORY:  He did graduate from high school.  He lives with  his grandmother.  PHYSICAL EXAMINATION:  GENERAL:  The patient is resting comfortably in bed, eyes closed.  He is easily arousable to voice.  HEENT:  General edematous condition with swelling of his eyelids.  It gives him appearance of ptosis.  A shunt was palpated.  It pumped and filled very easily.  We were able to occlude the proximal portion and pump and the bubble stayed depressed.  When we unplugged the proximal portion, it filled very quickly.  This is consistent with a well-working shunt.  Because of the size of the ventricles on the CT, I would not recommend pumping the shunt very often, as that may actually plug it.  NECK:  Supple, nontender.  NEUROLOGIC:  He is easily arousable.  Oriented x 3.  He is able to follow commands easily.  Pupils are reactive.  Extraocular movements are intact. Tongue is midline.  Face is symmetric.  Pharynx increases symmetrically.  He moves arms and legs appropriately to command.  Gait not tested.  LABORATORY DATA:  CT scan of the head done November 26, 2000 was evaluated, showing extremely small ventricles.  There are some postop changes, especially at the left posterior fossa.  There is a right posterior ventricular catheter that goes across his midline and this in the left lateral ventricle.  There are no comparison films but, according to an old report from 1999, the size of the ventricular system sounds  like it is stable.  ASSESSMENT AND PLAN:  This is a difficult case, as in any shunt evaluation, and not having any baseline information also makes this more difficult.  In this case, mainly because the shunt fills well and because the CT shows extremely small ventricles, I do not think shunt failure is a problem here.  I would recommend getting a shunt series for completeness of his shunt workup and to get baseline.  I also would repeat CT for any neurologic changes.  We can compare that to the films of June 4.  Also, I will be getting a  report, including the operative report.  Discharge summaries and copies of the CT reports from [facility] might also be helpful in the shunt evaluation.  We will follow over the weekend and especially follow up on the shunt series. DD:  11/29/00 TD:  11/30/00 Job: [id number]

## 2010-11-10 NOTE — Op Note (Signed)
. [facility]  Patient:    Cody Simon, Cody Simon                       MRN: [id number] Proc. Date: 12/09/00 Adm. Date:  [id number] Attending:  Madaline Guthrie                           Operative Report  PREOPERATIVE DIAGNOSIS:  Acute cholecystitis.  POSTOPERATIVE DIAGNOSIS:  Subacute cholecystitis.  OPERATION PERFORMED:  Laparoscopic cholecystectomy with intraoperative cholangiogram.  SURGEON:  Adolph Pollack, M.D.  ANESTHESIA:  General.  INDICATIONS FOR PROCEDURE:  This 30 year old male was admitted for medical reasons as he has multiple medical problems.  Over the past two days he developed progressively increasing right upper quadrant pain with elevation of liver function tests.  Gallbladder ultrasound demonstrated gallbladder sludge with thickened gallbladder wall.  Examination showed classic Eulah Pont sign in the right upper quadrant.  He does have a ventriculoperitoneal shunt present. He now presents for laparoscopic cholecystectomy and possible ventriculoperitoneal shunt removal.  DESCRIPTION OF PROCEDURE:  He was placed supine on the operating table and general anesthetic was administered.  His abdomen was sterilely prepped and draped.  A small subumbilical incision was made incising the skin sharply.  A midline fascia was identified and a small incision made in the midline fascia. The peritoneal cavity was entered sharply under direct vision.  Clear serous fluid was evacuated from the peritoneal cavity.  A pursestring suture of 0 Vicryl suture was placed around the fascial edges.  A Hasson trocar was introduced into the peritoneal cavity and pneumoperitoneum created by insufflation of CO2 gas.  Next the laparoscope was introduced.  The gallbladder appeared to be pale and somewhat inflamed although it was not gangrenous.  There was clear serous fluid present throughout the abdominal cavity.  Under direct vision, an 11 mm trocar was placed  through a similar sized incision in the epigastrium and two 5 mm trocars placed in the right abdomen.  The ascitic fluid was evacuated. Approximately 400 to 500 cc was evacuated.  The gallbladder fundus was grasped and adhesions between the omentum and duodenum and gallbladder were taken down sharply and bluntly.  The gallbladder was somewhat intrahepatic.  I then grasped the infundibulum and began dissecting it, completely mobilized the infundibulum exposing cystic duct gallbladder junction. There was an anterior branch of the cystic artery which was adherent to the cystic duct and I clipped and divided this.  I then made a small incision in the cystic duct and passed a cholangiocatheter through the abdominal wall into the cystic duct.  A cholangiogram was then performed.  Using real time fluoroscopy, cholangiography was performed.  I did not notice any filling defects in the common bile duct.  I noted the common hepatic and right and left hepatic ducts.  There was a long small cystic duct noted.  The cholangiocatheter was removed.  The cystic duct was then clipped three or four times proximally.  It was divided.  I examined the clips and some did not appear to come flush.  I pulled two off easily.  The other two were fairly firm.  I then placed an Endoloop on the cystic duct.  Next, I dissected out the cystic artery, clipped it and divided it.  This was a posterior branch of the cystic artery.  The gallbladder was then dissected free from the liver bed.  There was a very small  perforation made in the gallbladder with minimal spillage of bile as the  perforation was immediately sealed with a clamp.  It was then placed in an Endopouch bag.  The Endopouch bag was removed through the subumbilical port.  The subumbilical trocar was reinserted.  Next, I used 3L of saline to copiously irrigate out the perihepatic area and the abdominal cavity.  Bleeding points were controlled with the  cautery.  All blood clots were  removed.  The ventriculoperitoneal shunt was noted in the right upper quadrant and was not disturbed.  I made sure that all the fluid and the clot was evacuated around the shunt.  Next, I closed the fascial defect of the subumbilical incision with interrupted Vicryl sutures.  I then removed the trocars and released the pneumoperitoneum.  The fascial defect of the epigastric incision was closed with a single 0 Vicryl suture.  The skin incisions were closed with 4-0 Monocryl subcuticular stitches followed by Steri-Strips and sterile dressings.  The patient tolerated the procedure fairly well without any apparent intraoperative complications.  He was taken to the recovery room in satisfactory condition.  Intraoperatively, I talked with Dr. Phoebe Perch.  Postoperatively, we will continue to maintain him on intravenous antibiotics.  I did not see an indication to exteriorize the shunt at the time. DD:  12/09/00 TD:  12/10/00 Job: [id number]

## 2010-11-10 NOTE — Discharge Summary (Signed)
[place]. [facility]  Patient:    Cody Simon, Cody Simon                       MRN: [id number] Adm. Date:  [id number] Attending:  Madaline Guthrie Dictator:   Blanch Media, M.D. CC:         Cody Simon, M.D. (please fax)   Discharge Summary  DATE OF BIRTH:  May 02, 1981  CONSULTANTS:  1. [employer].  2. Dr. Adolph Simon.  3. Dr. Clydene Simon.  4. Dr. Helene Simon.  DISCHARGE DIAGNOSES:  1. Systemic lupus erythematosus nephritis.     a. 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     b. Twenty-four-hour urine protein 627 mg on December 03, 2000.     c. Maintained on oral prednisone.     d. ANA greater than 12,000, double-stranded DNA 1:160.  2. Pericardial tamponade.     a. Status post pericardiocentesis on November 27, 2000 with removal of one        liter of hemorrhagic pericardial fluid.     b. Simultaneous placement of a Swan-Ganz catheter with measurements        consistent with pericardial tamponade.     c. Post pericardiocentesis followup echocardiogram:  Resolution of the        pericardial fluid with no reaccumulation.     d. Repeat 2-D echocardiogram on December 16, 2000 secondary to new heart rub,        results pending at time of dictation.  3. Anemia, normocytic.     a. Likely iron deficient with a ferritin level of 993, a percent        saturation of 12%, iron of 20 and a total iron binding capacity of 172.     b. No evidence of hemolysis.     c. Required transfusion of packed red blood cells, two units on admission        and two units on December 12, 2000.     d. Hemoglobin trending down on December 16, 2000.  CT of the abdomen and        pelvis to rule out retroperitoneal bleed pending.     e. Abdominal ultrasound performed post renal biopsy negative for renal        hematoma.  4.  Hypertension.     a. Being controlled on labetalol and Norvasc.     b. ACE relatively contraindicated at this time as caused an elevation in        serum creatinine.  5. Acute cholecystitis.     a. Right upper quadrant ultrasound on December 09, 2000 showed gallbladder        sludge and wall thickening.     b. Alkaline phosphatase 182, total bilirubin 0.4, AST 52, ALT 92 on        December 09, 2000.     c. Status post laparoscopic cholecystectomy on December 09, 2000.     d. HIDA scan on December 11, 2000 showed no bile leakage.  6. Postoperative ileus.     a. Resolved with bowel rest and decreased narcotics.  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     b. Now para-hypopituitary.  Had received  growth hormone and testosterone        and Cortef, but now only receiving Synthroid.     c. 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     d. Shunt revision at [facility], 2001.  8. Organic brain syndrome.     a. Limited learning but graduated high school.     b. Previous presentation of nephrotic syndrome in July of 1999.     c. 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  BRIEF ADMITTING HISTORY:  [name] is a 30 year old black male who complained of facial edema, increased abdominal girth and lower extremity edema starting November 23, 2000.  On the morning of admission, the facial edema was severe enough to cause his right eye to completely close.  Patient says he has had mild fevers but had not checked his temperature.  He complains of chest pain with cough which is nonproductive.  He has had some shortness of breath and decreased energy.  He has had nausea and vomiting and diarrhea, although he denies abdominal pain or anorexia.  He has had some headache and oliguria compared to baseline.  ADMITTING MEDICATIONS:  1. Cortef 10 mg t.i.d.  2. Synthroid 0.5 mg p.o. q.d.  ADMITTING PHYSICAL EXAMINATION:  Temperature 97.1,  respiratory rate 20, heart rate 86, blood pressure 171/125, 91% on room air.  GENERAL:  This is an alert male in no acute distress who was very conversive and did not appear toxic. HEENT:  Positive facial edema, especially periorbital.  Extraocular muscles intact.  HEART:  Regular rate and rhythm.  No murmurs, rubs, or gallops. LUNGS:  Crackles, bibasilar, with good air movement.  ABDOMEN:  Positive bowel sounds.  Positive tenderness in the right upper quadrant.  Voluntary guarding. EXTREMITIES:  No edema.  DP pulses 1+/2.  NEUROLOGIC:  Nonfocal exam.  LABORATORY DATA:  Sodium 131, potassium 5.0, chloride 105, CO2 23, BUN 31, creatinine 2.1, glucose 81, AST 26, ALT 19, alkaline phosphatase 165, total bilirubin 0.6, albumin 1.9.  WBC 6.9, hemoglobin 5.7, platelets 253,000, RDW 19.8, MCV 86.7.  HOSPITAL COURSE: #1 - SLE NEPHRITIS:  This was confirmed with an ANA level and a double-stranded DNA level.  Nephrology was consulted early in the hospital course and started [name] on prednisone.  His serum creatinine peaked at a level of 2.4 on the 9th.  It trended down on steroids after that.  Patient was taken to have a repeat renal biopsy to see if his pathology had progressed compared to his previous biopsy results.  His renal biopsy was completed without any complication.  When preliminary results were obtained, it was decided not to start him on Cytoxan as he was responding to the prednisone and he was not an ideal candidate for cytotoxic therapy.   Patients serum creatinine trended down until he developed acute cholecystitis, when it started to rise again.  He had also been started on an ACE inhibitor just prior to his episode of acute cholecystitis, which could have also contributed to his increase in serum creatinine.  Patient was taken off his ACE inhibitor and as his acute cholecystitis resolved and he was no longer n.p.o. and per renal, his creatinine has trended down, and on the  day of dictation, the 24th of June, it is at level of 2.2.  His last creatinine prior to institution of  an ACE and his acute cholecystitis was a level of 1.3 on the 17th.  [name]  is being arranged to see a rheumatologist as an outpatient.  We had intended that he would be discharged from the hospital, but as he is still in the hospital, we will arrange transportation to Dr. Jeanine Luz office.  This is to get assistance in managing his extrarenal complications of lupus nephritis and assist with any additional treatment options.  #2 - PERICARDIAL TAMPONADE:  Cardiology was consulted and a 2-D echocardiogram was completed, diagnosing and confirming his pericardial tamponade.  Patient was taken emergently to the cath lab where he had a pericardiocentesis without a window placement and also had a Swan-Ganz catheter placed.  Prior to his pericardiocentesis, patients echo showed beginning of a right atrial collapse.  Post pericardiocentesis, the patient did well and a repeat echo showed no re-collection of the pericardial fluid.  On the 24th of June, patient had a pericardial rub which had not been present since his initial diagnosis at the beginning of the month.  A repeat 2-D echocardiogram was obtained and those results are pending at the time of this dictation.  #3 - HYPERTENSION:  Patient had an adverse effect to an ACE inhibitor with his serum creatinine increasing.  As of now, the patient is being maintained on labetalol and Norvasc.  These will need to be continued to be adjusted as an outpatient for maximum blood pressure control.  At time of discharge, the patients blood pressure has been running 150/90s.  #4 - ACUTE CHOLECYSTITIS:  Patient complained on admission of right upper quadrant pain and had slight elevation in his LFTs; however, other issues were ______  emergent and his abdominal pain resolved spontaneously.  On the morning of the 16th, the patient had significant right  upper quadrant pain.  A comprehensive metabolic panel was obtained which showed elevation in his enzymes.  His right upper quadrant ultrasound confirmed acute cholecystitis and surgery was consulted.  After obtaining consent from his grandmother, the patient went to the operating room for a laparoscopic cholecystectomy.  There was concern with patients VP shunt, that it might have to be externalized and neurosurgery was made aware of the condition and agreed to come in on the surgery if needed; however, thankfully, his shunt did not need to be externalized.  Patient was continued on broad-spectrum antibiotics for a total of eight days and was afebrile during the course.  Patient initially did well after surgery but had increased pain and nausea and vomiting on the 19th of June.  A HIDA scan was obtained to rule out bile leakage, which was negative. Abdominal series was obtained which did show the presence of postop ileus. Patient was put on bowel rest and had an NG and his ileus resolved spontaneously.  Patient was taking solid food without any discomfort beginning on the 20th.  #5 - ANEMIA:  Patient was admitted with a significant anemia with his hemoglobin at 5.7.  This was confirmed prior to transfusion of two units on the day of admission.  Iron studies were obtained which confirmed iron deficiency anemia.  Patient was maintained on EPO and iron throughout his hospital stay.  Patients hemoglobin began to drop and he required a repeat transfusion on the 20th of June.  Following his transfusion, his hemoglobin trended down from 9.4 after the two units to 9.1 to 8.8 to 8.5 to 7.9.  A CT of the abdomen and pelvis was obtained to rule out retroperitoneal bleed.  At the time of this dictation, it is pending.  Patient will likely be transfused an  additional two units prior to discharge.  Patient will not be discharged until he is stable and the cause of his drop in hemoglobin has  been determined.  #6 - SOCIAL ISSUES:  The patient had lived with his grandmother, who took excellent care of him, until she was admitted to an assisted living facility. 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  Patient has been evaluated to have a mental capacity of a 13- to 31 year old.  There was uncertainty in where the patient would be placed after his discharge.  As of now, the patient is going to be going home with his grandmother who can take care of him for two weeks.  After that time, it is unknown where the patient will go.  ADS has been notified and will be following up with [name].  An addendum to this discharge will be dictated on the date of his discharge. It will list discharge medications, the test results still pending and followup instructions.  This dictation was dictated early so that his hospital course would be summarized for consultants.  DD:  12/16/00 TD:  12/16/00 Job: [id number]
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
## 2010-11-10 NOTE — Consult Note (Signed)
[place]. [facility]  Patient:    Cody Simon, Cody Simon                       MRN: [id number] Proc. Date: 11/28/00 Adm. Date:  [id number] Attending:  Madaline Guthrie CC:         Madaline Guthrie, M.D.                          Consultation Report  REASON FOR CONSULTATION:  Renal insufficiency and nephrotic syndrome.  HISTORY OF PRESENT ILLNESS:  This is a 30 year old gentleman who presented on June 4 with progressive edema, abdominal swelling of at least 3 to 4 days, and his aunt says this was more.  He has a history of a pineal astrocytoma in 1991 with resection at [facility].  He had resulting quadriparesis as well as hydrocephalus.  He has a VP shunt.  In 1999, he presented with nephrotic syndrome and edema.  He had a renal biopsy done at [facility] which showed membranous lupus by the pediatric nephrology division there.  He was treated with steroids.  He now has progressive edema, decreased energy, PND, nocturia x 4 to 5.  No rash but chills.  No nausea, vomiting, diarrhea.  He has some arthralgias in his knees.  No sores in his mouth.  He has some chest pain with respiratory effort.  He denies knowing that he has had fevers.  He has very limited understanding of many questions.  He was admitted to house staff service last evening.  MEDICATIONS AT HOME: 1. Cortef 10 mg b.i.d. to t.i.d. 2. Synthroid 0.075 mg a day.  PAST MEDICAL HISTORY:   History of organic brain syndrome.  He has lupus.  He has had a high titer positive anti-DNA.  He was last seen at [facility] last year.  ALLERGIES:  No known drug allergies.  OPERATIONS:  As listed above.  REVIEW OF SYSTEMS:  HEENT: He says he has had some headaches but cannot describe them.  Cardiovascular: He gets short of breath with minimal exertion. His aunt says he does not get around the house much, but she knows he has been a little more dyspneic.  He has also had some PND.  He has severe edema all over.   GI: No real  specific symptoms.  Appetite has been good.  No nausea, vomiting, diarrhea.  Pulmonary: He has had a cough with some whitish phlegm. Skin: Negative.  Musculoskeletal: Arthritis in the knees only.  Neurological: He has had really not a lot of change in mental status from baseline.  He is just not getting around much and is a little weaker than usual.  SOCIAL HISTORY:  He did graduate from high school.  He lives with his aunt. He has had a dysfunctional family situation.  His mother did have rheumatoid arthritis.  OBJECTIVE:  GENERAL: This is a gentleman who is a very difficult historian.  He has a very puffy face.  He is extremely edematous.  He is slow to answer and has limited understanding.  HEENT:  Some exudate.  His disk margins are sharp.  VITAL SIGNS:  Blood pressure 182/130, lying down paradox at 26 to 28.  NECK:  Unremarkable with VP shunt which is felt.  CARDIOVASCULAR:  S4, grade 2/6 holosystolic murmur at the apex.  PMI is 13 cm lateral mid sternal line at fifth intercostal space.  Left ventricular lift. Cannot hear a rub.  He has 3 to 4+ edema.  Pulses 2+/4+.  LUNGS:  Decreased breath sounds.  No rales, rhonchi, or wheezes.  ABDOMEN:  Positive bowel sounds.  Liver is down 4 cm.  Positive spleen tip.  SKIN:  Dry, no rash.  No evidence of inflammatory arthritis in any of the joint.  LABORATORY DATA:  From review revealed initial hemoglobin of 5.9, white count 6900, platelets 263,000.  Sodium 131, potassium 5, chloride 105, bicarbonate 23, creatinine 2.1, BUN 31, glucose 81, albumin 1.9, bilirubin 0.6. Urinalysis positive hemoglobin, 300 mg% protein, 17 red cells, 0 to 3 white cells.  Chest x-ray: Large heart in a water-bottle fashion.  Some mild interstitial edema.  ASSESSMENT: 1. Proteinuria and edema consistent with nephrotic syndrome presumably    secondary to systemic lupus erythematosus and has ______ ______at his back    because this is also consistent  with lupus.  Very concerned that he has    had an acute nephrotic picture with his hypertension.  He also has    evidence of pericardial tamponade by clinical exam and now by    echocardiogram with some collapse.  His initial glomerular filtration    rate (GFR) was only about 40. Need to get his old records to find out    where he was three years ago when his creatinine was 0.3 to 0.4.    Worrisome issue is the pericardial disease at the current time and the    severe hypertension.  Before we lower his blood pressure, we need to get    his pericardium tapped.  Cardiology input is essential.  He probably    needs dialysis.  He needs diuresis, but cannot do this without this.  If    his creatinine is changed, he needs renal biopsy to see if he has    changed pathology.  I suspect he probably has.  The blood pressure is    consistent with acute episode. I suspect he has lupus and pericarditis    also.  He needs equivalent of prednisone 60 mg a day in the interim. 2. Anemia.  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  Need to work up that original blood needs steroids. 3. Pineal tumor and hydrocephalus.  Need to make sure his shunt is working as    this would aggravate his hypertension. 4. Mental retardation. 5. Spasticity. 6. Smoking. 7. Hypertension as above.  PLAN: 1. Cardiology to tap effusion. 2. Steroids. 3. Swan-Ganz. 4. Needs haptoglobin. 5. Twenty-four urine. 6. C3-C4. 7. Anti-DNA. 8. Lupus anticoagulase. 9. Get his old records. DD:  11/28/00 TD:  11/28/00 Job: [id number]

## 2010-11-10 NOTE — Cardiovascular Report (Signed)
[place]. [facility]  Patient:    Cody Simon, Cody Simon Visit Number: [id number] MRN: [id number]          Service Type: Attending:  Lennette Bihari, M.D. Dictated by:   Lennette Bihari, M.D. Proc. Date: 11/27/00   CC:         [facility] Govern, Office                        Cardiac Catheterization  PROCEDURE:  Swan-Ganz catheterization, right femoral arterial line and pericardiocentesis.  INDICATIONS:  [name] is a 30 year old black male who I was asked to see in cardiology consultation by the teaching service for evaluation of possible pericardial effusion.  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  He subsequently developed pain and hypopituitarism and was on steroid replacement therapy.  He has a history of systemic lupus erythematosus as well as the nephrotic syndrome and recently was documented to have 3770 mg of proteinuria.  He presented to the emergency room today with increasing swelling and edema.  On examination, blood pressure was 200/70 but there was a suggestion of pulsus paradox.  Chest x-ray showed a water bottle cardiac configuration highly suggestive of a large pleural effusion.  An echocardiogram was obtained which confirmed the significant effusion.  Due to potential impending tamponade hemodynamics, the patient was now brought to the cardiac catheterization laboratory to undergo pericardiocentesis with Swan-Ganz catheterization and right arterial line access.  DESCRIPTION OF PROCEDURE:  After premedication with Valium, the patient was prepped and draped in the usual fashion.  The right femoral vein was punctured and a venous sheath was inserted without difficulty.  A Swan-Ganz catheter was advanced under fluoroscopic guidance to the RA, RV, pulmonary artery, and pulmonary capillary wedge position.  In addition, the right femoral artery was punctured and an  arterial sheath was inserted for documentation of arterial pressure.  Attention was then directed in performing the pericardiocentesis. The subxiphoid region was prepped and draped in a sterile fashion.  This was initially difficult due to the patients inability to lie flat and his abdominal panniculus.  Ultimately, with the assistance of Dr. Alanda Amass, the pericardiocentesis was successful and approximately one liter of hemorrhagic pericardial fluid was removed.  The fluid was sent for CBC, cell ______ , protein, LDH, glucose, TB, ANA, LE prep, aerobic as well as anaerobic cultures.  This was also done under echo guidance.  The patient tolerated the procedure well.  The pigtail catheter in the pericardium was hooked up to a Hemovac for continued ongoing dwelling and removal.  HEMODYNAMIC DATA:  At the start of the procedure, right atrial pressure was 15.  Right ventricular pressure was 40/15.  Pulmonary artery pressure was 40/20 with a mean of 32.  Pulmonary capillary wedge pressure mean was 17. Cardiac output by the thermodilution method was 4.4, by the Fick method was 6.9 liters per minute.  Cardiac index was 2.4 and 3.8 liters per minute per m sq, respectively.  O2 saturation was 93% in the artery and was 63% in a pulmonary artery.  Central aortic pressure was 240/140.  The pressure varied with respiration to 195/130 consistent with a 30-45 mm paradox.  After the pericardiocentesis, the patients blood pressure was still markedly elevated at 230/150.  He was given 20 mg of IV labetalol plus started on a 2-mm per minute drip.  There was complete resolution of  his pulsus paradox down to 15 mm.  The echo guidance confirmed a significant improvement in the previously noted very large pleural effusion.  There was no further evidence for RV collapse but a persistent effusion was still present posterolaterally.  Before the tap, arterial blood gas was 7.29, CO2 36, O2 67, and O2  saturation of 91%.  The patient tolerated the procedure well and felt significantly improved following the procedure. Dictated by:   Lennette Bihari, M.D. Attending:  Lennette Bihari, M.D. DD:  06/11/01 TD:  06/11/01 Job: [id number]

## 2010-11-10 NOTE — Op Note (Signed)
[place]. [facility]  Patient:    Cody Simon, Cody Simon Visit Number: [id number] MRN: [id number]          Service Type: MED Location: [phone] Attending Physician:  Levy Sjogren Dictated by:   Tanya Nones. Jeral Fruit, M.D. Proc. Date: 03/13/01 Admit Date:  03/12/2001                             Operative Report  no dictation. Dictated by:   Tanya Nones. Jeral Fruit, M.D. Attending Physician:  Levy Sjogren DD:  03/13/01 TD:  03/14/01 Job: [id number]
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
## 2010-11-10 NOTE — Op Note (Signed)
[place]. [facility]  Patient:    LOVE, MILBOURNE                       MRN: [id number] Proc. Date: 12/03/00 Adm. Date:  [id number] Attending:  Madaline Guthrie                           Operative Report  PROCEDURE:  Left percutaneous renal biopsy.  REASON FOR PROCEDURE:  Hematuria, renal insufficiency in the setting of lupus.  DESCRIPTION OF PROCEDURE:  The risks of the procedure were explained to the patient and his grandmother and specifically a 1 in 10 chance of gross hematuria, 1 in 100 chance of requiring blood transfusion, 1 in 1000 chance of requiring surgical intervention to stop bleeding, and 1 in 5000 chance of nephrectomy.  Both the patient and the grandmother understood the risks and were willing to proceed with the procedure.  Informed consent was obtained by the grandmother.  The patient was placed in the prone position and the left flank was prepped and draped in the usual sterile fashion.  Local anesthesia was obtained with 10 cc of 1% lidocaine.  Under direct ultrasound guidance, a 15 gauge ASAP biopsy needle was placed in the inferior pole of the left kidney on three separate occasions with return of three cores of tissue.  The patient tolerated the procedure well. DD:  12/03/00 TD:  12/03/00 Job: [id number]

## 2010-11-10 NOTE — Discharge Summary (Signed)
[place]. [facility]  Patient:    Cody Simon, Cody Simon Visit Number: [id number] MRN: [id number]          Service Type: MED Location: [phone] Attending Physician:  Madaline Guthrie Dictated by:   Marisue Brooklyn, M.D. Admit Date:  03/12/2001 Discharge Date: 03/31/2001   CC:         Dr. __________  Dineen Kid. Reche Dixon, M.D., primary care [place]   Discharge Summary  Dictated by Arlys John ___________.  DATE OF BIRTH:  1981-03-25.  PATIENTS HOME ADDRESS:  [address] [place], [place], [address].  TELEPHONE NUMBER:  [phone].  SERVICE:  Internal Medicine Teaching Service.  DISCHARGE DIAGNOSES: 1. Hydrocephalus. 2. Ventriculoperitoneal shunt failure. 3. Hypertension.  DISCHARGE MEDICATIONS: 1. Levothyroxine 50 mcg p.o. once a day. 2. Pantoprazole 40 mg p.o. q.d. 3. Prednisone 10 mg p.o. q.d. 4. Phenytoin 200 mg p.o. q.h.s. 5. Labetalol 500 mg p.o. b.i.d. 6. Clonidine patch 0.2 mg two by transdermal patch q. week. 7. Hydrochlorothiazide 25 mg p.o. q.d.  DISPOSITION:  Home.  CONDITION ON DISCHARGE:  Stable.  FOLLOW-UP:  Appointment with Arlys John __________, April 10, 2001, at 2:45, at the [facility] at [facility].  HOSPITAL COURSE:  The patient is a 30 year old African-American male admitted for obtundation.  The patient had a right VP shunt placed several weeks earlier. The patients CT scan showed hydrocephalus.  The patient was taken to surgery by Tanya Nones. Jeral Fruit, M.D., and a second VP shunt was placed.  Over the next several days, the VP shunt was revised and eventually internalized. At discharge, the patients hydrocephalus had resolved and the patient was stable on home medications. At the time of admission, the patient was unresponsive.  At discharge, the patient had returned to his baseline neurologic examination.  LABS DURING HOSPITALIZATION:  At admission, CMP showed white blood cells  7.7, hemoglobin 11.3, hematocrit 33.4, platelet count 360; neutrophils 78%, lymphocytes 12%, monocytes 10%, eosinophils 0, and basophils 0.  PT 12.9, INR 1.0, PTT 33.  March 14, 2001, amylase 183.  Urinalysis on March 27, 2001, showed specific gravity of 1.01, pH 7, negative glucose, bilirubin negative, ketones negative, small amount of blood, protein 100 mg/dL, urobilinogen 0.2 mg/dL, nitrates negative, leukocytes negative.  A 24-hour urine catacholamine obtained on March 28, 2001, was unable to be quantitated due to interfering substances in the patient.  A 24-hour urine sodium showed 179 mmol/24 hours. A urine sodium showed 107 mmol/liter.  Urine total volume was 1.675 liters. Urine protein was 159 mg/dL.  A 24-hour urine protein was 2.663 g/24 hours. On March 28, 2001, a TSH showed 3.029.  RADIOLOGY:  March 12, 2001, CT of the head showed hydrocephalus. March 12, 2001, chest x-ray was normal.  March 14, 2001, CT of the head showed a new periventricular bleed with blood extending into the occipital horns of both lateral ventricles. The left shunt was noted. Hydrocephalus was still present.  March 15, 2001, right shunt was noted. No change in blood within the third ventricle observed the previous day. Continued hydrocephalus.  March 15, 2001, chest x-ray showed endotracheal tube satisfactorily placed.  Left lower extremity atelectasis. March 18, 2001, chest x-ray showed left lower lobe atelectasis improved. March 21, 2001, CT of the head without contrast showed the right frontal shunt tube has been removed with a small amount of parenchymal hemorrhaged demonstrated within the right frontal lobe at the site of the previously demonstrated tube.  Small bone fragments are  also demonstrated in that region. There has been no significant change in the small amount of parenchymal hemorrhage surrounding the proximal portion of the left frontal shunt tube. The  right parietal shunt tube is unchanged.  There has been interval decompression of the lateral and third ventricles with normal size ventricles currently demonstrated.  A cavum septum pellucidum is noted.  There is significantly less blood within the third ventricle.  The remaining blood within the ventricle measures 3.1 x 0.7 cm in maximal dimensions.  There is also less blood in the occipital horns of the lateral ventricles with a small amount of residual blood remaining bilaterally.  Mild diffuse enlargement of the subarachnoid spaces is demonstrated.  The visualized portions of the paranasal sinuses are normally pneumatized.  Encephalomalacia in the posterior inferior portions of the cerebellar hemisphere is stable. There has been no significant change in ___________ enlargement of the adjacent ventricles. The previously demonstrated intraventricular air has resolved.  March 21, 2001, a PICC line was placed successfully.  March 24, 2001, CT of the head showed no hydrocephalus.  Decreasing amounts of blood in the third ventricles and frontal lobes.  March 31, 2001, CT of the head showed decreasing amount of hemorrhage within the third ventricle and in the right frontal lobe. Otherwise no significant changes from CT scan on March 21, 2001.  No hydrocephalus.  PROCEDURES: 1. March 13, 2001, the patient had a left ventricular peritoneal shunt    placed. 2. March 17, 2001, the patient the distal end of the left VP shunt    externalized. 3. March 28, 2001, left VP shunt internalized.  CONSULTATIONS: 1. Neurosurgery. 2. General surgery.  HISTORY AND PHYSICAL:  The patient was a 30 year old who presented with hydrocephalus. The patient required placement of a left VP shunt.  After VP shunt placement, hydrocephalus resolved.  The patient also developed hypertension during his hospitalization.  The patients hypertension was  controlled medically.  HOSPITAL  COURSE:  #1 -  HYDROCEPHALUS:  The patients hydrocephalus has resolved and is currently not a problem.  Left and right VP shunts appear to be working normally.  #2 - HYPERTENSION: The patient had several episodes of hypertension during his hospitalization.  The patients blood pressure at admission was in the 120s/80s.  After VP shunt administration and revision, the patients blood pressure became elevated into the 200s/100s.  The patients blood pressure was controlled on medication.  Etiology of hypertension is unknown.  The patient has had similar episodes of elevated blood pressure on past admissions.  Of note, previous admission in July, the patient had elevated blood pressures. This should be followed up on and would recommend evaluating for conditions causing secondary hypertension including pheochromocytoma, renal vascular stenosis, other endocrinologic disorders, and kidney disease.  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  The patients UAs have shown 100 mg/dL of protein.  In addition, 24-hour urine protein showed increased levels of protein.  #3 - DEPRESSION: The patient has had numerous serious medical problems in his life.  The patient has at times had a flat affect and sad mood.  The patient would likely benefit from an evaluation for depression and considering initiation of medical antidepressant.  DISCHARGE LABS:  White blood cells 8.2, hemoglobin 9.9, hematocrit 28.7, platelet count 326, MCV 84.8.  Sodium 133, potassium 4.2, chloride 99, CO2 26, glucose 87, BUN 13, creatinine 1.1, calcium 8.7.  Urine sodium 107, 24-hour urine sodium 179, 24-hour urine output 1.675 liters, urine protein 159,  24-hour urine protein 2663.  The patients vital signs at discharge showed temperature 98.5, blood pressure stable at 138/80, pulse of 102, 98% on room air for O2 sat. Dictated by:   Marisue Brooklyn, M.D. Attending Physician:  Madaline Guthrie DD:  04/02/01 TD:   04/02/01 Job: [id number]

## 2010-11-10 NOTE — Op Note (Signed)
[place]. [facility]  Patient:    Cody Simon, Cody Simon Visit Number: [id number] MRN: [id number]          Service Type: MED Location: [phone] Attending Physician:  Levy Sjogren Dictated by:   Tanya Nones. Jeral Fruit, M.D. Proc. Date: 03/13/01 Admit Date:  03/12/2001                             Operative Report  PREOPERATIVE DIAGNOSIS:  Nonobstructive hydrocephalus, failure of right ventriculoperitoneal shunt.  POSTOPERATIVE DIAGNOSIS:  Nonobstructive hydrocephalus, failure of right ventriculoperitoneal shunt.  PROCEDURE:  Left ventriculoperitoneal shunt, ______ pressure of 93.  SURGEON:  Tanya Nones. Jeral Fruit, M.D.  ASSISTANT:  Hewitt Shorts, M.D.  CLINICAL HISTORY:  Mr. Gombos is a 30 year old gentleman who was admitted to [facility] a few weeks ago after the right shunt was not working.  It seemed that this shunt was inserted at [facility].  He has a history of brain tumor, though many years ago.  In the emergency room, I tapped his shunt and eventually he was admitted to the ICU and the shunt continued to work and he was discharged.  Yesterday, the family brought him in the afternoon because he was unconscious for at least 10 hours.  I drained the shunt and the patient woke up slightly.  CT scan showed hydrocephalus.  Because of that, we decided to proceed with a new shunt in the left side.  The decision was not to remove the old one on the right side.  The grandmother, who is the caretaker, knows about the risks such as infection, the shunt not working, need for revision of the shunt and also because of the history of infection in the abdomen and the possibility of damage and bleeding into the abdominal cavity and damage to the bowel.  DESCRIPTION OF PROCEDURE:  The patient was taken to the OR and the left side of the head was prepped with Betadine.  Prior to that, the head was shaved. Then with Betadine, we prepped the abdomen,  chest wall, neck and head.  Drapes were applied.  Incision in the left side in the frontal area away from the midline and inferior to the coronal suture was made.  A hole was drilled and the dura mater was coagulated.  Then the shunt was brought underneath the scalp down to the postauricular area.  From then on, it was brought and a shunt passer was inserted all the way to the abdominal cavity.  In the abdominal cavity, we made an incision in the left upper quadrant through the skin and through the muscle layer.  It was difficult to differentiated the peritoneum.  We found quite a bit of adhesions between the omentum and the abdominal wall.  Lysis was accomplished and we were able to see the abdominal cavity.  Investigation showed that indeed there were no bowel loops in the area of adhesion.  Then, the catheter was inserted into the abdominal cavity. The catheter remained in the left upper quadrant because secondary to the adhesions, it was difficult to push it distally; nevertheless, it was working nicely before it was inserted.  This procedure was done after we introduced the catheter into the ventricular cavity.  The shunt was put together using the connectors which were attached to the tube using silk.  At the end, we had a shunt that was working nicely.  Then, the scalp at the  neck was closed with Vicryl and staples and in the abdominal cavity, we closed the wound with Vicryl and Steri-Strips.  The patient is going back to the intensive care unit. Dictated by:   Tanya Nones. Jeral Fruit, M.D. Attending Physician:  Levy Sjogren DD:  03/13/01 TD:  03/14/01 Job: [id number]

## 2010-11-10 NOTE — Discharge Summary (Signed)
. [facility]  Patient:    Cody Simon, Cody Simon Visit Number: [id number] MRN: [id number]          Service Type: MED Location: [phone] Attending Physician:  Levy Sjogren Dictated by:   Harvest Dark, M.S. IV Admit Date:  02/20/2001 Discharge Date: 02/28/2001                             Discharge Summary  DISCHARGE DIAGNOSES: 1. Altered mental status secondary to ventriculoperitoneal shunt dysfunction. 2. Elevated white blood count. 3. Systemic lupus with pericarditis and glomerulonephritis. 4. 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 5. Pericardial tamponade in July 2002. 6. Normocytic anemia with a baseline hemoglobin of 9.5. 7. 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 8. Status post laparoscopic cholecystectomy in June 2002, with a complication    of hepatic hematoma. 9. Hypertension.  DISCHARGE MEDICATIONS: 1. Lasix 80 mg p.o. q.d. 2. Labetalol 300 mg b.i.d. 3. Dilantin 100 mg b.i.d. 4. Ferrous gluconate 325 mg p.o. b.i.d. 5. Aldactone 25 mg q.d. 6. Synthroid 100 mcg q.d. 7. Prednisone 10 mg q.d. 8. Norvasc 10 mg q.d. 9. Cardura 4 mg b.i.d.  DISPOSITION AND FOLLOWUP:  The patient is to follow up in the [facility] within one to two weeks following discharge.  He will be examined for altered mental status, as well as ventricular pressure.  PROCEDURES PERFORMED: 1. Aspiration of cerebrospinal fluid on 02/20/01. 2. Placement of PICC line on 02/23/01. 3. CT scan of head without contrast on 02/21/01. 4. CT scan of head without contrast on 02/25/01.  CONSULTATIONS:  Tanya Nones. Jeral Fruit, M.D., specialty of neurosurgery.  HISTORY OF PRESENT ILLNESS:  The patient is a 30 year old African-American male who presented with loss of consciousness.  He was found by his family during the evening to be unresponsive when they tried to awaken him.  He  was brought to [facility] for evaluation.  He had not been having any chest pain, shortness of breath, fevers, chills, change in eating patterns, or dysuria prior to this episode.  The family denied any seizure activity in the patient, however, the patient does have a significant history of seizures.  He did not have any tongue biting or incontinence.  The patient was compliant with all medications.  PHYSICAL EXAMINATION:  VITAL SIGNS:  Temperature 98.7, blood pressure 162/120, pulse 88, respiratory rate 12, O2 saturation 99% on room air.  GENERAL:  Obtunded African-American male withdraws to pain, nonverbal.  HEENT:  Pupils slightly reactive.  No blood in oropharynx.  NECK:  Supple, no lymphadenopathy.  CHEST:  Clear to auscultation bilaterally.  No wheezes, rhonchi, or rales.  CARDIOVASCULAR:  Regular rate and rhythm, 2/6 systolic murmur, loudest at apex, 2+ pulses bilaterally.  No jugular venous distension.  ABDOMEN:  Laparoscopic scars noted, no rigidity, guarding, rebound, tenderness.  Positive bowel sounds.  EXTREMITIES:  No cyanosis, no rashes.  NEUROLOGIC:  Obtunded, withdraws to pain, no eye opening.  Tone and reflexes are grossly normal.  Moves all four extremities.  ADMISSION LABORATORY DATA:  White blood cell count 12.3, hemoglobin 11.9, hematocrit 41.8, MCV 88.2.  ANC 10.1.  Sodium 136, potassium 5.2, chloride 101, bicarbonate 28, BUN 23, creatinine 1.2, glucose 89.  Albumin 3.0, calcium 9.2.  PT 11.8, INR 0.8, PTT 34.  Electrocardiogram showed normal sinus rhythm, no ischemic changes.  Chest x-ray showed no  significant findings.  CT of head showed greatly increased ventricular size since former film.  Shunt shown to be in place within right ventricle.  HOSPITAL COURSE:  #1 - ALTERED MENTAL STATUS:  The patient was initially worked up with various labs.  A TSH, BMP, CBC, CMP, chest x-ray, and CT of the head were all done to determine possible cause  for acute mental status changes.  CT of the head showed greatly increased ventricular size.  Neurosurgery was then called as a consult.  Neurosurgery then aspirated 20 cc of cerebrospinal fluid through his shunt which seemed to be functioning with puffing.  The patient gradually began to become more oriented.  Initial treatment involved scheduled pumping of his shunt.  Neurosurgery continued to follow the patient, and felt that shunt possibly had been kinked into peritoneal.  On 02/27/01, the patient received a repeat CT scan which showed INCOMPLETE Dictated by:   Harvest Dark, M.S. IV Attending Physician:  Levy Sjogren DD:  03/03/01 TD:  03/03/01 Job: [id number]

## 2010-11-10 NOTE — Op Note (Signed)
Cody Simon, Cody Simon                ACCOUNT NO.:  [id number]   MEDICAL RECORD NO.:  [id number]          PATIENT TYPE:  INP   LOCATION:  5032                         FACILITY:  [facility]   PHYSICIAN:  Quita Skye. Hart Rochester, M.D.  DATE OF BIRTH:  May 12, 1981   DATE OF PROCEDURE:  10/04/2004  DATE OF DISCHARGE:                                 OPERATIVE REPORT   PREOPERATIVE DIAGNOSIS:  End-stage renal disease.   POSTOPERATIVE DIAGNOSIS:  End-stage renal disease.   OPERATION:  Insertion of a left forearm AV Gortex graft, brachial artery-to-  basilic vein (4 mm x 7 mm stretch).   SURGEON:  Quita Skye. Hart Rochester, M.D.   FIRST ASSISTANT:  Rowe Clack, P.A.-C.   ANESTHESIA:  Local.   PROCEDURE:  The patient was taken to the operating room and placed in the  supine position at which time the left upper extremity was prepped with  Betadine scrub and solution and draped in a routine sterile manner.  After  infiltration of 1% Xylocaine with epinephrine a transverse incision was made  in the antecubital area.  The antecubital vein dissected free.  Cephalic  branch was about 3 mm in size and was gently dilated with heparinized saline  to see if it would be satisfactory for a fistula.  It could not be palpated  in the mid-to-upper arm despite the arm being relatively thin.  A small  Fogarty catheter was passed up the vein and inflated and the vein seemed to  be irregular and not of large enough caliber to be satisfactory for a  fistula.  Therefore, the basilic vein, which was an excellent vein, being 5  mm in size was dissected free.  Brachial artery exposed and circled with  vessel loops.  A loop shaped tunnel was then created and the patient was  given 3000 units of Heparin intravenously.   The artery was occluded proximally and distally with vessel loops, opened  with a 15-blade and extended with the Potts' scissors.  The 4-mm end of the  graft was spatulated and anastomosed end-to-side with 6-0 Prolene.   The 7-mm  end was spatulated and anastomosed end-to-side to the vein with the 6-0  Prolene.  The clamps were then released and there was a palpable pulse and  thrill in the graft.  There was also audible radial and ulnar flow with the  graft open.  The wound was irrigated with saline, closed in layers with  Vicryl in a subcuticular fashion.  Sterile dressing applied.  Patient taken  to the recovery room in satisfactory condition.      JDL/MEDQ  D:  10/04/2004  T:  10/04/2004  Job:  [id number]

## 2010-11-10 NOTE — Op Note (Signed)
[place]  Patient:    Cody Simon, Cody Simon Visit Number: [id number] MRN: [id number]          Service Type: REC Location: [facility] Attending Physician:  Dayle Points Dictated by:   Adolph Pollack, M.D. Admit Date:  12/24/2000 Discharge Date: 03/24/2001                             Operative Report  NO DICTATION Dictated by:   Adolph Pollack, M.D. Attending Physician:  Dayle Points DD:  03/28/01 TD:  03/29/01 Job: [id number]

## 2010-11-10 NOTE — Consult Note (Signed)
NAMEDONAL, LYNAM                ACCOUNT NO.:  [id number]   MEDICAL RECORD NO.:  [id number]          PATIENT TYPE:  INP   LOCATION:  2930                         FACILITY:  [facility]   PHYSICIAN:  Cecille Aver, M.D.DATE OF BIRTH:  [birth year]   DATE OF CONSULTATION:  09/28/2004  DATE OF DISCHARGE:                                   CONSULTATION   CHIEF COMPLAINT:  Renal failure.   HISTORY OF PRESENT ILLNESS:  The majority of this history is obtained from  notes and prior files as the patient was minimally communicative.  He is a  30 year old African-American male with a past medical history that I will  outline below, who presented to the ED with a facial laceration.  He was  found to have blood pressure of 200/140 and a creatinine of 3.7.  His last  known prior creatinine was 1.0 in May of 2005.  The patient was given 80 mg  of IV labetalol in the ED with minimal change.  He was admitted to the  medicine teaching service.  He currently voices no complaints, but has had a  headache and nausea and vomiting today during this admission.   PAST MEDICAL HISTORY:  1.  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  2.  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  3.  Chronic anemia.  4.  Hypopituitarism.  5.  Organic brain syndrome.   PAST SURGICAL HISTORY:  He has had multiple revisions of bilateral  ventriculoperitoneal shunts, the last of these appeared to have been in  2003.  He is status post pericardial centesis in June of 2002, status post  cholecystectomy in 2002, status post pineal astrocytoma resection in 1991.   SOCIAL HISTORY:  The patient is single.  He works as a Public affairs consultant.  He  smokes one pack per day of tobacco, denies alcohol and drugs.  He lives in a  group home at [facility] in [place], [place] and [place]  [place].   FAMILY  HISTORY:  Unobtainable.   ALLERGIES:  No known drug allergies.   MEDICATIONS:  1.  Synthroid 50 mcg daily.  2.  Prednisone 10 mg daily and 2.5 mg at night.  3.  Catapres transdermal 0.2 mg q.week.  4.  Lasix 20 mg daily.  5.  Kay Ciel 10 mEq daily.  6.  Dilantin 100 mg b.i.d.  7.  Protonix 40 mg daily.  8.  Epogen 40,000 units q.7 days.  9.  Norvasc 10 mg daily.  10. Iron sulfate 325 mg t.i.d.   REVIEW OF SYSTEMS:  Unobtainable.   PHYSICAL EXAMINATION:  VITAL SIGNS:  TMAX 97.8, blood pressure 140 to 190  over 90 to 120, heart rate 80 to 90, respiratory rate 10 to 20, and 99% on  room air.  Currently he is on a labetalol drip at 3 mg per minute.  Thus far  since admission early this morning, he has had 450 mL of urinary  output.  GENERAL:  The patient is obtunded, but responds to commands.  Minimal verbal  communication.  He does not appear to be in distress.  CHEST:  Clear to auscultation bilaterally.  Normal effort.  There is a  midsternal scar.  HEENT:  Left nasal laceration.  Left eye is swollen shut.  There is dried  blood in the nares.  NECK:  Supple, no thyromegaly, no lymphadenopathy, no JVD.  Tracheotomy scar  and left clavicular scar.  HEART:  Regular rate.  Normal S1 and S2.  No murmurs.  There is a mild left  chest heave with no PMI displacement.  ABDOMEN:  Soft, nontender, and nondistended.  No organomegaly, no mass,  positive bowel sounds.  EXTREMITIES:  There is significant lower extremity edema bilaterally.  His  right lower extremity is very tender to palpation, but there is no erythema  or sign of trauma.  He has bounding bilateral pulses distally x4.  NEUROLOGY:  Moves all extremities x4.  Negative Babinski.  Gross sensation  is intact distally.   LABORATORY DATA:  Sodium 129, potassium 4.6, chloride 106, bicarb 18, BUN  46, creatinine 3.5, calcium 7.4, magnesium 1.6, phosphorus 5.7, AST 18, ALT  11, alkaline phosphatase 78, bilirubin 0.4, albumin 1.8, total  protein 5.6.  White blood cell count 7, hemoglobin 8.2, platelets 168, MCV 86.2, UA  reveals specific gravity 1.015, moderate blood, greater than 300 protein,  negative nitrite, negative LE, 3 to 6 red blood cells.  TSH and T4 are  normal.  Dilantin is less than 2.5.  Urine drug screen is negative.  The  first set of cardiac enzymes is negative.  Urine sodium is 65, urine  creatinine 61, INR 0.8, PTT 33.  An anti-DNA, 24-hour protein and  creatinine, iron studies, B12, folate, and lipid profile are all pending.   IMAGING:  A head CT revealed VP shunts in stable position.  There is a right  frontal white matter lesion that is more conspicuous than on prior exams.  A  sinus CT reveals left nasal fracture, anterior subluxation of the temporal  joint, and chronic sinusitis.  Renal ultrasound reveals bilateral echogenic  renal parenchyma, no hydronephrosis.  The right kidney is 12.9 cm, the left  kidney is 12.5 cm.   ASSESSMENT:  A 30 year old African-American male with known history of lupus  nephritis, who presents with acute versus acute on chronic versus chronic  renal failure.   PLAN:  Given his history, this certainly could be a relapse of his lupus  nephritis, however, it is difficult to distinguish between this relapse  versus hypertensive kidney disease.  We will also wait on the 24-hour urine  protein to look for nephrotic syndrome, however, there does appear to be a  nephritis here as well.  We will check a first a.m. urine to look for casts.  At this time, the primary objective is to control blood pressure.  We will  increase his labetalol to 400 mg p.o. t.i.d. and increase his Lasix to 80 mg  p.o. b.i.d. and provide p.r.n. metoprolol and monitor his pressure from  there and titrate regimen as needed.  We will also check for double stranded  DNA and complement levels to support possible lupus nephritis relapse. There is very little utility in repeating a biopsy at this point.   In  addition, starting suppressive therapy in light of no true evidence of  active lupus nephritis in this patient with very poor compliance, would not  be suggested.  We will control the blood pressure first and monitor  creatinine and GFR, then if the evidence points toward lupus nephritis, we  must ensure close follow-up and compliance prior to aggressive  immunosuppressive therapy.  We agree with starting his Epogen, however, if  iron stores are depleted, we will need to restore iron before the Epogen  will help with his hemoglobin.   Of note, it appears looking through the old records, his highest creatinine  at any time during his troubles in 2002 and 2003 was 2.4, thus he has  certainly had advanced renal disease since that point.  However, his last  known contact with physicians was May of 2005, in which his creatinine was  known to be 1.0 and labs were otherwise normal and  the patient appeared to be doing quite well.  Apparently, he may have had  some contact with Areatha Keas, M.D. since that time.  His office is closed  this afternoon, thus we will try to contact them in the morning to see if  there are any more recent records or evidence of treatments.  We will  certainly follow closely along with primary team.      TV/MEDQ  D:  09/28/2004  T:  09/29/2004  Job:  [id number]

## 2010-11-10 NOTE — Discharge Summary (Signed)
[place]  Patient:    Cody Simon, Cody Simon Visit Number: [id number] MRN: [id number]          Service Type: MED Location: [id number] Attending Physician:  Madaline Guthrie Dictated by:   Lendell Caprice, M.D. Admit Date:  03/12/2001 Discharge Date: 03/31/2001   CC:         Dr. Ermalene Postin, M.D.  Dyke Maes, M.D.  Marlan Palau, M.D.  Dineen Kid Reche Dixon, M.D.   Discharge Summary  DISCHARGE DIAGNOSES:  1. 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  2. Status post mediastinoscopy with visceral and parietal pericardectomy.  3. Pleural effusion.  4. Subcapsular hepatic hematoma.  5. Hypokalemia.  6. Anemia.  7. History of lupus nephritis.  8. Acute renal insufficiency.  9. Epicardial hematoma. 10. Hypertension. 11. Gastric ileus. 12. Urinary tract infection. 13. Depression. 14. Seizure.  DISCHARGE MEDICATIONS:  1. Prednisone 50 mg followed by taper.  2. Aldactone 25 mg one p.o. q.d.  3. Cardura 4 mg one p.o. b.i.d.  4. Dilantin 200 mg one a day and 100 mg every night before bed.  5. Dulcolax 10 mg one p.o. q.d.  6. Fergon 300 mg one p.o. b.i.d.  7. Lasix 80 mg one p.o. q.12h.  8. Labetalol 600 mg one p.o. b.i.d.  9. Norvasc 10 mg one p.o. q.d. 10. Protonix 40 mg one p.o. q.d. 11. Synthroid 100 mg one p.o. q.d.  FOLLOW-UP:  Mr. Sem will follow up at the [facility] on January 29, 2001, at 2:30 p.m. for hospital follow-up.  He will be seen by Helene Kelp, M.D., in rheumatology on February 12, 2001, at 9 a.m.  He has a follow-up appointment with Deanna Artis. Hickling, M.D., in neurology to be scheduled by the patients family after discharge.  PROCEDURES AND DIAGNOSTIC STUDIES:  Echocardiogram done December 25, 2000. Impression:  Overall LV systolic function was normal.  The EF was estimated to be 55 to 65%.  There was a large _________ pericardial  effusion. Effusion dimension 68 mm, the possibility of __________________ could not be excluded on the basis of available images.  CT scan of abdomen and pelvis showed (1) large subcapsular hematoma of the right hepatic lobe; (2) Note is made of multiple ________ retroperitoneal lymph nodes which are likely reactive in nature; (3) ascites; (4) large pericardial effusion; (5) left pleural effusion and left lower lobe atelectasis.  The patient underwent central line placement on December 25, 2000.  CT of the chest January 08, 2001, with impression of moderately large pericardial effusion, decreased significantly from the prior study. Large hepatic subcapsular hematoma unchanged, no evidence of new hemorrhage, no change in ascites.  Constipation and ileus.  CT of the head on January 17, 2001, with impression of no evidence of acute endocranial or calvarial injury.  Postop changes, atrophy and encephalomalacia, are noted. CT of the head on January 19, 2001, with impression of stable CT of the brain, no change in size of the ventricles, no change in diffuse atrophy and small vessel disease.  PROCEDURE:  Medial sternotomy and radical pericardectomy done by Alleen Borne, M.D., on January 09, 2001.  EEG:  Normal.  CONSULTANTS: 1. Dr. Evelene Croon. 2. Dr. Kae Heller. 3. Dyke Maes, M.D. 4. Madaline Savage, M.D. 5. Marlan Palau, M.D. 6. Helene Kelp, M.D. 7. Jasmine Pang, M.D., psychiatry.  HISTORY OF PRESENT ILLNESS:  [name] is a 30 year old African-American male with a significant past medical history including lupus with history of lupus nephritis and lupus pericarditis, previous astrocytoma status post shunt which was last revised in fall of 2001, hypopituitarism secondary to surgery who presents for evaluation of nausea, vomiting, and abdominal pain.  He was recently admitted to the B service from November 26, 2000, to December 17, 2000, in which he had a cholecystectomy and  pericardiocentesis.  He was discharged on December 17, 2000, on the following new medications:  Plaquenil and prednisone. He awoke this morning with nausea and vomited four times which was nonbloody and nonbilious. He had abdominal pain which was worse in the right upper quadrant.  He reports passing flatus and having a hard bowel movement this morning.  He denies any headache, chest pain, shortness of breath, palpitations, dizziness, or syncope.  He does report pain from his recent surgery and pericardiocentesis in the left upper thorax.  ADMISSION LABORATORIES:  Alkaline phosphatase 125, albumin 2.5. Sodium 131, potassium 5.5, chloride 100, CO2 29, BUN 24, creatinine 1.4, glucose 95. White blood cell count 17.3, hemoglobin 9.3, platelets 501.  HOSPITAL COURSE:  The following is a summary of a very complicated and long hospital course.  #1 - PERICARDIAL EFFUSION SECONDARY TO LUPUS PERICARDITIS:  The patient was evaluated by CVTS on admission.  A stat echo revealed pericardial effusion but no evidence of true cardiac tamponade. The patient was treated conservatively throughout his hospitalization and his vital signs were followed in the intensive care unit.  Because this patient had a subscapular hepatic hematoma that was very large, he was not taken for a pericardectomy immediately on admission.  He was followed conservatively and on January 09, 2001, Dr. Evelene Croon performed a percardectomy of the visceral and parietal pleura without complications.  The patient was discharged on January 24, 2001, after having no complications from this procedure.  His staples were removed and his wound was healing quite well.   #2 - SUBSCAPULAR HEPATIC HEMATOMA:  The patient was found to have this finding on CT scan when he presented in July 2002.  He was followed with serial CBCs. On the day of discharge he had no evidence of abdominal pain, nausea or vomiting.  #3 - PLEURAL EFFUSIONS:  #4 -  HYPERKALEMIA:  The patients potassium was followed throughout his hospitalization and repleted as needed.  He came in with a high potassium but had no evidence of EKG changes and no symptoms.  #5 - ANEMIA:  The patient has a history of anemia and his baseline hemoglobin is approximately 9.5.  On the day of discharge, his hemoglobin was 9.1.  This will need to be followed in the outpatient setting by his primary care physician, Dineen Kid. Reche Dixon, M.D.  #6 - ACUTE RENAL INSUFFICIENCY:  During this hospitalization, the patient underwent oliguric acute renal insufficiency and was followed by renal consultants.  He was started on prednisone for probable lupus exacerbation. He was hydrated and his creatinine was followed by renal.  On the day of discharge, his creatinine was 1.2.  #7 - EPICARDIAL HEMATOMA:  The patient was managed by CVTS and cardiology throughout his hospitalization.  #8 - HYPERTENSION:  The patient had very high blood pressures and treated with several different agents including hydralazine, labetalol, and Lasix.  He was discharged on Norvasc, labetalol, and Lasix therapy.  This will need to be adjusted after he is over this acute illness.  #9  - GASTRIC ILEUS:  The patient  experienced a gastric ileus during his hospitalization and was put on NPO and NG tube feedings.  He was able to recover from this and had regular bowel movements by the day of discharge.  On CT scan, the ileus had resolved on the day of discharge.  #10 - URINARY TRACT INFECTION:  The patient was treated with three days of Cipro.  #11- DEPRESSION:  The patient had one episode of suicidal ideation and psychiatry was consulted.  He was not felt to be a threat to himself or others.  He will need to be followed in the outpatient setting and potentially may benefit from antidepressant therapy should this continue.  #12 - HISTORY OF SEIZURE ON January 19, 2001:  The patient had a normal EEG but was loaded  with Dilantin and was seizure-free after this event.  A CT scan was negative for any acute etiology of this seizure. The patient was discharged on Dilantin with follow-up arranged with Dr. Sharene Skeans in neurology.  #13- LUPUS:  Dr. Phylliss Bob from rheumatology, saw the patient during the hospitalization and he was started on prednisone.  He will be seeing Dr. Phylliss Bob in follow-up at which time he will be in the middle of the prednisone taper. Dr. Phylliss Bob to address future issues with management of lupus.  DISCHARGE LABORATORIES:  Sodium 136, potassium 3.9, chloride 102, CO2 27, BUN 28, creatinine 1.2, total bilirubin 0.7, glucose 88. Dilantin on January 24, 2001, was 13.4. White blood cell count 9.9, hemoglobin 9.1. Dictated by:   Lendell Caprice, M.D. Attending Physician:  Madaline Guthrie DD:  05/13/01 TD:  05/14/01 Job: [id number]

## 2010-11-10 NOTE — Discharge Summary (Signed)
Cody Simon, Cody Simon                ACCOUNT NO.:  [id number]   MEDICAL RECORD NO.:  [id number]          PATIENT TYPE:  INP   LOCATION:  5525                         FACILITY:  [facility]   PHYSICIAN:  Hettie Holstein, D.O.    DATE OF BIRTH:  August 30, 1980   DATE OF ADMISSION:  08/02/2006  DATE OF DISCHARGE:  08/05/2006                               DISCHARGE SUMMARY   PRIMARY CARE PHYSICIAN:  Tesfaye D. Felecia Shelling, M.D.   PRIMARY NEPHROLOGIST:  Dr. Darrick Penna.   PRINCIPAL DIAGNOSIS:  Supertherapeutic Dilantin level.   ADDITIONAL DIAGNOSES:  1. Severe anemia felt to be related to anemic of chronic disease      status post inpatient evaluation without evidence of hemoccult      positive stools of blood in the stools.  He underwent transfusion      with 3 units of packed red blood cells to correct a hemoglobin of      6.5 and a final hemoglobin of 9.5.  2. 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  His creatinine at time of discharge is 2.26,      correcting from 4.1.  3. Seizure disorder.  4. Organic brain syndrome.  5. History of VP shunt.   DISPOSITION:  1. At present, Cody Simon is medically stable in improved condition with      normalizing Dilantin level with recommendations that he undergo a      Dilantin level check on the morning of August 06, 2006.  If this      is in the range of 10-20, he can resume his Dilantin at pharmacy      recommended dose of 200 mg p.o. b.i.d.  2. He can resume his levothyroxine at 50 mcg daily as before.  3. Calcitriol 0.5 mg twice daily as before.  4. Omeprazole 20 mg daily as before.  5. Prednisone 10 mg daily as before.  6. Cellcept 500 mg twice daily as before.  7. Invega 6 mg daily as before.  8. Rozerem as before.  9. Rena-Vite as before.  10.The only medication change during this hospitalization at discharge      is the change in his dosing  of his Dilantin at 200 mg p.o. b.i.d.      with recommendations to recheck Dilantin level one week afterwards.      If his Dilantin level is in the therapeutic range tomorrow a.m.   HISTORY OF PRESENT ILLNESS:  For full details, please refer to the H&P  as dictated by Dr. Corky Downs.  However briefly, Cody Simon is a 30 year old  male with a history of lupus as well as pineal astrocytoma, status post  radiation treatment and VP shunt.  He has a seizure disorder and chronic  kidney disease managed by [facility].  He went for his  routine followup and some labs have been requested.  The results  revealed severe anemia with a hemoglobin of 5.5.  In addition, it  was  noted that the patient had stopped Aranesp in December 2007.  There has  been no history of malonic stools or hematemesis.  He was quite drowsy  on admission and was unable to provide a great deal of history.   HOSPITAL COURSE:  The patient was admitted.  It was determined that he  had a Dilantin level of 44.6.  His Dilantin was held during his hospital  course and trended down to 23.5 and it continues to be held.  We are  recommending that this be checked in the morning at which time he can  resume 200 b.i.d. dosing.  He was seen by his nephrologist of [facility] who followed him throughout his course and with some  gentle hydration his biopsy-proven lupus nephritis and chronic renal  disease achieved his baseline creatinine levels and renal function.  Again, his fecal occult blood testing was negative.  He underwent 3  units of PRBCs transfusion with improvement and stabilization.  At  present, he is hemodynamically stable, neurologically improved, and at  his baseline and thought to be suitable for transfer back to his group  home.   LABORATORY DATA:  Final Dilantin level of 23.5.  His BUN is 75,  creatinine 2.26.  Iron saturation was 35%.      Hettie Holstein, D.O.  Electronically  Signed     ESS/MEDQ  D:  08/05/2006  T:  08/05/2006  Job:  [id number]   cc:   Deterding, Dr.  Ninetta Lights D. Felecia Shelling, MD

## 2010-11-10 NOTE — Discharge Summary (Signed)
NAMEEVERT, Cody Simon                ACCOUNT NO.:  [id number]   MEDICAL RECORD NO.:  [id number]          PATIENT TYPE:  INP   LOCATION:  5032                         FACILITY:  [facility]   PHYSICIAN:  Alvester Morin, M.D.  DATE OF BIRTH:  Mar 11, 1981   DATE OF ADMISSION:  09/27/2004  DATE OF DISCHARGE:                                 DISCHARGE SUMMARY   CONSULT OBTAINED:  Dr. Darrick Penna at [facility].   DISCHARGE DIAGNOSES:  1.  Acute-on-chronic renal failure most likely secondary to uncontrolled      hypertension.  2.  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  3.  Hyperkalemia secondary to the renal failure.  4.  Hypertension.  5.  Nasal fracture.   PAST MEDICAL HISTORY:  1.  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  2.  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  3.  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  4.  Chronic anemia.  5.  Organic brain syndrome with encephalomalacia.  6.  Seizures.  7.  Chronic anemia most likely secondary to the renal failure.  8.  History of acute cholecystitis in 2002 status post cholecystectomy.  9.  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   DISCHARGE MEDICATIONS:  1.  Synthroid 50 mcg p.o. daily.  2.  Epogen 4000 units subcutaneously every 7 days.  3.  Iron sulfate 325 mg p.o. t.i.d.  4.  Norvasc 10 mg p.o. daily.  5.  Catapres 0.3 mg patch to be changed every 7 days.  6.  Labetalol 600 mg p.o. b.i.d.  7.  Nephro-Vite vitamin tablet p.o. daily.  8.  Dilantin 100 mg p.o. b.i.d.  9.  Minoxidil 2.5 mg p.o. b.i.d.  10. Vicodin 5/500 one to two tablets p.o. q.4h. p.r.n. pain.  11. Senokot-S one tablet p.o. q.h.s. p.r.n. constipation.  12. Prednisone 60 mg p.o. daily for the next 4 weeks, followed by taper over      the subsequent 2 months.   DISPOSITION AND FOLLOW-UP:  The patient will follow up with Dr. Darrick Penna at  [facility] on October 19, 2004 at 3 p.m. He will receive a BMET to follow  up on creatinine as well as potassium. He also will need a blood pressure  check since he needs better control of his blood pressure. Also, he needs  follow up on his free Dilantin level which has been pending on day of  discharge to titrate his Dilantin dose.   CONSULTS OBTAINED:  1.  Dr. Darrick Penna at [facility].  2.  Dr. Waverly Ferrari with [facility].   PROCEDURES DONE:  1.  Left upper arm A-V fistula placed on October 04, 2004 by Dr. Madilyn Fireman.  2.  Renal ultrasound performed on September 28, 2004 showed bilateral echogenic      renal parenchyma which is a nonspecific indicator of medical renal      disease and was negative for hydronephrosis.  3.  A CT scan performed of the maxilla and face on September 28, 2004 showed  minimally-displaced left nasal bone fracture with no other facial bone      fractures, and mild anterior subluxation of the TM joint and chronic      sinusitis.  4.  CT of the head performed on September 28, 2004 showed bilateral      ventriculostomy shunts in stable position, evolution of previous      encephalomalacia, and changes in left interior cerebellar hemisphere,      high left parietal lobe, and basal ganglia that were stable.   ADMISSION HISTORY AND PHYSICAL:  Mr. Ogawa is a 30 year old African-American  male with an extensive past medical history as indicated above that  presented to the emergency room with a facial laceration. In the ED he was  found to have a blood pressure of 200/40 and a creatinine of 3.7 and thus we  were called for admission. He does not have any active complaints. Of note,  his creatinine in May 2005 was 1.   SUBSTANCE HISTORY:  The patient is a current smoker, smokes one pack per  day. No history of alcohol, cocaine, or IV drug use.   SOCIAL HISTORY:  He is single, was working as a Public affairs consultant, and has [employer]  and lives in [place].   PHYSICAL  EXAMINATION:  VITAL SIGNS:  Pulse 88, blood pressure 198/137,  temperature 97.6, respirations 16, O2 saturations 100% on room air.  GENERAL EXAMINATION:  The patient in no acute distress, lying in bed.  EYE EXAM:  The patient does have a laceration on the lateral aspect of the  nose extending into the cheek that has been sutured.  NECK:  Supple, no JVD, no bruits.  RESPIRATIONS:  Clear to auscultation bilaterally. No rales, rhonchi,  wheezing.  CARDIOVASCULAR:  Regular rate and rhythm. No murmurs, rubs, or gallops.  GASTROINTESTINAL:  Soft, nontender, nondistended, positive bowel sounds.  EXTREMITIES:  No clubbing, cyanosis, or edema.   ADMISSION LABORATORY DATA:  Sodium 129, potassium 4.6, bicarb 18, BUN 46,  creatinine 3.5, albumin 1.8, calcium 7.4. Hemoglobin 8.2 with MCV 86, WBC 7,  platelets 168. Dilantin less than 0.5. UA showed protein greater than 300.  Magnesium 1.6. UDS was negative.   HOSPITAL COURSE:  #1 - ACUTE-ON-CHRONIC RENAL FAILURE. The patient had a  baseline creatinine of 1 in May 2005. However, he was lost to follow-up and  did not have any further creatinines. On day of admission, his creatinine  was 3.7. A FENa was obtained which was 2.9 indicating renal cause of renal  failure. Urine sediment was obtained which was negative for dirty casts. A  renal ultrasound was obtained which was negative for hydronephrosis. Also,  Dr. Darrick Penna had seen the patient in the past in his clinic and thus was  consulted. He believes that the renal failure is secondary to the  uncontrolled hypertension. Over hospital course the patient's creatinine  continued to rise with creatinine of 5 on day of discharge. [facility] was  consulted since the patient is heading towards dialysis and they placed an A-  V fistula in the left upper extremity on October 04, 2004. The patient will  follow up with Dr. Darrick Penna on October 19, 2004. Also, he has been placed on Epogen for anemia of chronic disease  secondary to his renal failure. Also  note that 24-hour urine protein was obtained which was over 12 g, indicating  nephrotic range proteinuria.   #2 - UNCONTROLLED HYPERTENSION. Multiple medicines were added to his blood  pressure regimen  because the patient had uncontrolled hypertension. His  regimen on day of discharge as far as his blood pressure is as follows:  Norvasc 10 mg p.o. daily, Catapres 0.2 mg patch to be changed every 7 days,  labetalol 600 mg p.o. b.i.d., and minoxidil 2.5 mg p.o. b.i.d. His blood  pressure on day of discharge was 146/77 with a range of 131 to 146 systolic  over 58 to 80 diastolic. Also, an attempt was made to start an ACE inhibitor  in the lisinopril but that lead to an increase in the patient's potassium  and thus it had to be discontinued.   #3 - QUESTIONABLE LUPUS NEPHRITIS. Since the patient presented in acute  renal failure and had a history of lupus nephritis diagnosed per biopsy in  June 2002 this was included in the differential. A double-stranded DNA  antibody level was obtained which was negative. Also, complement levels were  obtained which were low, suggesting lupus nephritis. The patient was started  on prednisone 60 mg p.o. daily which will be continued for the next 4 weeks  and then tapered over the next 2 months. Per renal, the patient was not a  candidate for pulse dose cyclophosphamide because of poor follow-up.   #4 - HYPERKALEMIA. The patient had a bump in potassium after lisinopril was  added and potassium continued to drop after the lisinopril was discontinued.  Potassium on day of discharge was 5.3.   #5 - PANHYPOPITUITARISM. A free T4 was obtained which was 0.89 and thus no  changes were made to his Synthroid. Also, the patient was on prednisone 10  mg in the morning and 2.5 mg in the evening which was then changed to 60 mg  p.o. daily secondary to presumed lupus nephritis.   #6 - ANEMIA. The patient had a workup for anemia.  His ferritin was 696, B12  was 394, rbc folate was 521. His iron panel was consistent with anemia of  chronic disease which is most likely secondary to the renal failure. He was  started on the EPO protocol and will continue to get Epogen subcutaneously  every week at the rest home.   #7 - FACIAL LACERATION. This was sutured in the emergency room and the  sutures were removed on day of discharge. The patient did not have any  active complaints.   #8 - SEIZURE DISORDER. The patient takes Dilantin 100 mg p.o. b.i.d. A  Dilantin level was obtained and the corrected Dilantin level was 6. A free  Dilantin level is pending on day of discharge and will be followed up at the  hospital follow-up visit and changes in his Dilantin dose will be titrated  accordingly.   DISCHARGE LABORATORY DATA:  Hemoglobin 9.2, white count 13.4, platelets 347.  Sodium 129, potassium 5.4, chloride 91, bicarb 21, BUN 61, creatinine 5,  glucose 92.  DISCHARGE VITAL SIGNS:  Temperature 97.2, pulse 76, respirations 20, blood  pressure 146/77, saturating 97% on room air.      BP/MEDQ  D:  10/05/2004  T:  10/05/2004  Job:  [id number]

## 2010-11-10 NOTE — Consult Note (Signed)
[place]. [facility]  Patient:    Cody Simon, Cody Simon Visit Number: [id number] MRN: [id number]          Service Type: MED Location: [id number] Attending Physician:  Madaline Guthrie Proc. Date: 02/20/01 Admit Date:  02/20/2001                            Consultation Report  REASON FOR CONSULTATION:  Inability to insert Foley catheter.  HISTORY OF PRESENT ILLNESS:  The patient is a 30 years old male seen in the emergency room for VP shunt failure.  The nurses could not insert a Foley catheter.  I was called in for insertion of that catheter.  I instilled 1 ampule of Xylocaine jelly in the urethra, and then a #16 Coude catheter was inserted without difficulty in the bladder.  The catheter was left to straight drainage. Attending Physician:  Madaline Guthrie DD:  02/20/01 TD:  02/20/01 Job: [id number]

## 2010-11-10 NOTE — Op Note (Signed)
[place]. [facility]  Patient:    Cody Simon, Cody Simon Visit Number: [id number] MRN: [id number]          Service Type: MED Location: [phone] Attending Physician:  Levy Sjogren Dictated by:   Tanya Nones. Jeral Fruit, M.D. Proc. Date: 03/17/01 Admit Date:  03/12/2001                             Operative Report  PREOPERATIVE DIAGNOSIS:  Malfunction of the left ventriculoperitoneal shunt.  POSTOPERATIVE DIAGNOSIS:  Malfunction of the left ventriculoperitoneal shunt.  PROCEDURE:  Externalization of the distal part of the shunt.  SURGEON:  Tanya Nones. Jeral Fruit, M.D.  ASSISTANTMena Goes. Franky Macho, M.D.  CLINICAL HISTORY:  Cody Simon is a gentleman who had a brain tumor and after that, he has had multiple shunts inserted at [facility] and [place].  He came several weeks ago, and the shunt was still working after he has a _____ infection.  Nevertheless, the shunt is still working in the right side but now a few days ago he came back in the emergency room unconscious.  We found that he had hydrocephalus, and a new VP shunt in the left side was inserted. Nevertheless, although we were able to drain quite a bit of CSF, the patient was not waking up.  Repeat x-ray showed hydrocephalus.  An intrathecal catheter was inserted last night.  He pulled it out this morning.  Today we are taking him to surgery to revise the shunt.  DESCRIPTION OF PROCEDURE:  The patient was taken to the OR, and we had several steps to do.  The one was to go ahead and remove the old shunt and put in a new one, but we decided to make the incision in the left side of the clavicle. The incision was carried out through the skin down to subcutaneous tissue.  We were able to bring the catheter outside and under sterile conditions, we put it to drain.  Indeed, the catheter was draining really nicely.  The CSF was bloody.  We decided not to insert a new one but to externalize the shunt  for drainage, and in that way we can clear up the CSF of blood and then probably proceed with a new one either into the ventricle or into the right atrium of the heart.  The catheter is going to be in drain until the CSF is clear. Dictated by:   Tanya Nones. Jeral Fruit, M.D. Attending Physician:  Levy Sjogren DD:  03/17/01 TD:  03/18/01 Job: [id number]

## 2010-11-10 NOTE — Op Note (Signed)
[place]. [facility]  Patient:    Cody Simon, Cody Simon Visit Number: [id number] MRN: [id number]          Service Type: MED Location: [id number] Attending Physician:  Madaline Guthrie Dictated by:   Tanya Nones. Jeral Fruit, M.D. Proc. Date: 03/28/01 Admit Date:  03/12/2001 Discharge Date: 03/31/2001                             Operative Report  PREOPERATIVE DIAGNOSIS:  Malfunction of left ventriculoperitoneal shunt.  POSTOPERATIVE DIAGNOSIS:  Malfunction of left ventriculoperitoneal shunt.  PROCEDURE:  Insertion of peritoneal catheter of the ventriculoperitoneal shunt.  SURGEONS:  Tanya Nones. Jeral Fruit, M.D., Adolph Pollack, M.D.  CLINICAL HISTORY:  Cody Simon is a gentleman who has hydrocephalus, has a history of _____, and a shunt inserted at [facility] many years ago.  We inserted the one in the left side because the right one was not functioning; nevertheless, though the valve of the shunt was working, he continued to develop hydrocephalus.  The shunt was externalized, and we are going to insert it now into the [place] from the general surgery department.  DESCRIPTION OF PROCEDURE:  Cody Simon was taken to the OR, and the nurse _____ was to pull the catheter, which was coming in the left side of the neck, and I put a new connector, which was tied to the catheter using an 0 silk.  Then using a shunt passer, I tunneled the catheter all the way down to the left upper quadrant, where Dr. Abbey Chatters with the laparoscopy had already the clamps to pull out the catheter into the cavity.  Essentially this procedure was mostly performed by Dr. Abbey Chatters, and mine was limited only to bring the catheter _____ down into the abdominal cavity.  After Dr. Abbey Chatters finished, the patient was taken to the OR.  Dr. Abbey Chatters himself dictated the operative note, which was [phone]. Dictated by:   Tanya Nones. Jeral Fruit, M.D. Attending Physician:  Madaline Guthrie DD:   03/31/01 TD:  03/31/01 Job: [id number]

## 2010-11-10 NOTE — Op Note (Signed)
[place]. [facility]  Patient:    KENTON, FORTIN Visit Number: [id number] MRN: [id number]          Service Type: Attending:  Adolph Pollack, M.D. Dictated by:   Adolph Pollack, M.D. Proc. Date: 03/28/01   CC:         Tanya Nones. Jeral Fruit, M.D.   Operative Report  PREOPERATIVE DIAGNOSIS:  Hydrocephalus with malfunctioning ventriculoperitoneal shunt.  POSTOPERATIVE DIAGNOSIS:  Hydrocephalus with malfunctioning ventriculoperitoneal shunt.  PROCEDURE:  Insertion of ventriculoperitoneal shunt to peritoneal cavity by way of laparoscopy.  SURGEON:  Adolph Pollack, M.D.  Mammie LorenzoTanya Nones. Jeral Fruit, M.D.  INDICATIONS:  Mr. Cerda is a 30 year old male who has a chronic VP shunt in for hydrocephalus.  He has been having problems with occlusion of the VP shunt. He has had attempts at revisions and Dr. Jeral Fruit has asked me to assist him in inserting the VP shunt into the peritoneal cavity.  The shunt is currently exteriorized.  DESCRIPTION OF PROCEDURE:  He was placed supine on the operating table and general anesthesia was administered.  The neck, chest and abdomen were sterilely prepped and draped.  Dr. Jeral Fruit tunneled a new segment of VP shunt from the neck area subcutaneously down to the left mid abdominal area.  While he was doing this, I made an incision in the subumbilical area, incised the fascia and gained access to the peritoneal cavity under direct vision.  I placed a pursestring suture of 0 Vicryl on the fascial edges.  A Hasson trocar was introduced into the peritoneal cavity and pneumoperitoneum created by insufflation of CO2 gas.  There is a previous incision in the left mid abdomen and I made a puncture wound with a needle under direct laparoscopic vision into the peritoneal cavity and passed a wire through this.  I then dilated the tract.  Dr. Jeral Fruit brought out the ventriculoperitoneal shunt through the same puncture  wound/incision that I had used.  I subsequently placed the 10-French dilator introducer into the peritoneal cavity under direct vision and removed the dilator wire and threaded the shunt in through the introducer.  I then peeled away the introducer.  I manipulated the shunt with the laparoscopic grasper to left lower quadrant.  The shunt appeared to lie without acute angles.  I then released the pneumoperitoneum and closed the subumbilical defect with tightening and tying down the pursestring suture.  I closed the skin incisions with 4-0 Monocryl subcuticular stitches.  Steri-Strips and sterile dressings were applied.  He tolerated the procedure well without any apparent complications.  He was taken back to the recovery room, then to 3100, in satisfactory condition. Dictated by:   Adolph Pollack, M.D. Attending:  Adolph Pollack, M.D. DD:  03/28/01 TD:  03/29/01 Job: [id number]

## 2014-08-12 ENCOUNTER — Emergency Department (HOSPITAL_COMMUNITY): Payer: Medicaid Other

## 2014-08-12 ENCOUNTER — Emergency Department (HOSPITAL_COMMUNITY)
Admission: EM | Admit: 2014-08-12 | Discharge: 2014-08-12 | Disposition: A | Discharge status: Home / Self Care | Payer: Medicaid Other | Attending: Emergency Medicine | Admitting: Emergency Medicine

## 2014-08-12 ENCOUNTER — Encounter (HOSPITAL_COMMUNITY): Payer: Self-pay

## 2014-08-12 DIAGNOSIS — Z86011 Personal history of benign neoplasm of the brain: Secondary | ICD-10-CM | POA: Insufficient documentation

## 2014-08-12 DIAGNOSIS — Z8719 Personal history of other diseases of the digestive system: Secondary | ICD-10-CM | POA: Insufficient documentation

## 2014-08-12 DIAGNOSIS — N189 Chronic kidney disease, unspecified: Secondary | ICD-10-CM

## 2014-08-12 DIAGNOSIS — R05 Cough: Secondary | ICD-10-CM | POA: Diagnosis not present

## 2014-08-12 DIAGNOSIS — Z87820 Personal history of traumatic brain injury: Secondary | ICD-10-CM | POA: Insufficient documentation

## 2014-08-12 DIAGNOSIS — R112 Nausea with vomiting, unspecified: Secondary | ICD-10-CM | POA: Insufficient documentation

## 2014-08-12 DIAGNOSIS — Z8739 Personal history of other diseases of the musculoskeletal system and connective tissue: Secondary | ICD-10-CM | POA: Diagnosis not present

## 2014-08-12 DIAGNOSIS — N186 End stage renal disease: Secondary | ICD-10-CM | POA: Diagnosis not present

## 2014-08-12 DIAGNOSIS — R059 Cough, unspecified: Secondary | ICD-10-CM

## 2014-08-12 DIAGNOSIS — E875 Hyperkalemia: Secondary | ICD-10-CM | POA: Diagnosis not present

## 2014-08-12 DIAGNOSIS — I120 Hypertensive chronic kidney disease with stage 5 chronic kidney disease or end stage renal disease: Secondary | ICD-10-CM | POA: Diagnosis not present

## 2014-08-12 DIAGNOSIS — Z992 Dependence on renal dialysis: Secondary | ICD-10-CM | POA: Insufficient documentation

## 2014-08-12 HISTORY — DX: Unspecified intracranial injury with loss of consciousness of unspecified duration, initial encounter: S06.9X9A

## 2014-08-12 HISTORY — DX: Unspecified disorder of circulatory system: I99.9

## 2014-08-12 HISTORY — DX: Unspecified convulsions: R56.9

## 2014-08-12 HISTORY — DX: Acute embolism and thrombosis of unspecified vein: I82.90

## 2014-08-12 HISTORY — DX: Hypo-osmolality and hyponatremia: E87.1

## 2014-08-12 HISTORY — DX: Benign neoplasm of brain, unspecified: D33.2

## 2014-08-12 HISTORY — DX: Abnormal electrocardiogram (ECG) (EKG): R94.31

## 2014-08-12 HISTORY — DX: Personal history of other diseases of the nervous system and sense organs: Z86.69

## 2014-08-12 HISTORY — DX: Cerebral infarction, unspecified: I63.9

## 2014-08-12 HISTORY — DX: Unspecified intracranial injury with loss of consciousness status unknown, initial encounter: S06.9XAA

## 2014-08-12 HISTORY — DX: Occlusion and stenosis of unspecified vertebral artery: I65.09

## 2014-08-12 HISTORY — DX: Systemic lupus erythematosus, unspecified: M32.9

## 2014-08-12 HISTORY — DX: Epistaxis: R04.0

## 2014-08-12 HISTORY — DX: Hydrocephalus, unspecified: G91.9

## 2014-08-12 HISTORY — DX: Essential (primary) hypertension: I10

## 2014-08-12 HISTORY — DX: Disorder of kidney and ureter, unspecified: N28.9

## 2014-08-12 HISTORY — DX: Coagulation defect, unspecified: D68.9

## 2014-08-12 HISTORY — DX: Personal history of other specified conditions: Z87.898

## 2014-08-12 HISTORY — DX: Nocturnal enuresis: N39.44

## 2014-08-12 LAB — BASIC METABOLIC PANEL
ANION GAP: 15 (ref 5–15)
BUN: 53 mg/dL — ABNORMAL HIGH (ref 6–23)
CHLORIDE: 96 mmol/L (ref 96–112)
CO2: 27 mmol/L (ref 19–32)
Calcium: 9 mg/dL (ref 8.4–10.5)
Creatinine, Ser: 10.62 mg/dL — ABNORMAL HIGH (ref 0.50–1.35)
GFR calc non Af Amer: 6 mL/min — ABNORMAL LOW (ref >90)
GFR, EST AFRICAN AMERICAN: 6 mL/min — AB (ref >90)
Glucose, Bld: 104 mg/dL — ABNORMAL HIGH (ref 70–99)
POTASSIUM: 6.4 mmol/L — AB (ref 3.5–5.1)
SODIUM: 138 mmol/L (ref 135–145)

## 2014-08-12 LAB — CBC WITH DIFFERENTIAL/PLATELET
BASOS ABS: 0.1 10*3/uL (ref 0.0–0.1)
Basophils Relative: 1 % (ref 0–1)
Eosinophils Absolute: 0.7 10*3/uL (ref 0.0–0.7)
Eosinophils Relative: 8 % — ABNORMAL HIGH (ref 0–5)
HCT: 42.3 % (ref 39.0–52.0)
Hemoglobin: 13.9 g/dL (ref 13.0–17.0)
LYMPHS ABS: 1.5 10*3/uL (ref 0.7–4.0)
LYMPHS PCT: 17 % (ref 12–46)
MCH: 33.8 pg (ref 26.0–34.0)
MCHC: 32.9 g/dL (ref 30.0–36.0)
MCV: 102.9 fL — ABNORMAL HIGH (ref 78.0–100.0)
MONO ABS: 1 10*3/uL (ref 0.1–1.0)
Monocytes Relative: 11 % (ref 3–12)
NEUTROS ABS: 5.5 10*3/uL (ref 1.7–7.7)
NEUTROS PCT: 63 % (ref 43–77)
Platelets: 172 10*3/uL (ref 150–400)
RBC: 4.11 MIL/uL — ABNORMAL LOW (ref 4.22–5.81)
RDW: 16 % — AB (ref 11.5–15.5)
WBC: 8.8 10*3/uL (ref 4.0–10.5)

## 2014-08-12 LAB — HEPATIC FUNCTION PANEL
ALBUMIN: 3.9 g/dL (ref 3.5–5.2)
ALT: 78 U/L — AB (ref 0–53)
AST: 46 U/L — AB (ref 0–37)
Alkaline Phosphatase: 285 U/L — ABNORMAL HIGH (ref 39–117)
BILIRUBIN DIRECT: 0.1 mg/dL (ref 0.0–0.5)
Indirect Bilirubin: 0.5 mg/dL (ref 0.3–0.9)
Total Bilirubin: 0.6 mg/dL (ref 0.3–1.2)
Total Protein: 8.8 g/dL — ABNORMAL HIGH (ref 6.0–8.3)

## 2014-08-12 LAB — LIPASE, BLOOD: LIPASE: 65 U/L — AB (ref 11–59)

## 2014-08-12 MED ORDER — DEXTROSE 50 % IV SOLN
INTRAVENOUS | Status: AC
  Administered 2014-08-12: 50 mL via INTRAVENOUS
  Filled 2014-08-12: qty 50

## 2014-08-12 MED ORDER — SODIUM POLYSTYRENE SULFONATE 15 GM/60ML PO SUSP
15.0000 g | Freq: Once | ORAL | Status: AC
  Administered 2014-08-12: 15 g via ORAL
  Filled 2014-08-12: qty 60

## 2014-08-12 MED ORDER — INSULIN ASPART 100 UNIT/ML IV SOLN
10.0000 [IU] | Freq: Once | INTRAVENOUS | Status: AC
  Administered 2014-08-12: 10 [IU] via INTRAVENOUS

## 2014-08-12 MED ORDER — DEXTROSE 50 % IV SOLN
1.0000 | Freq: Once | INTRAVENOUS | Status: AC
Start: 2014-08-12 — End: 2014-08-12
  Administered 2014-08-12: 50 mL via INTRAVENOUS

## 2014-08-12 NOTE — ED Notes (Signed)
Discharge instructions reviewed with pt, questions answered. Pt verbalized understanding.  

## 2014-08-12 NOTE — ED Notes (Signed)
Pt reports last emesis episode was last night. Pt just started dialysis and reports decrease in urine.

## 2014-08-12 NOTE — Discharge Instructions (Signed)
Dialysis  Dialysis is a procedure that replaces some of the work healthy kidneys do. It is done when you lose about 85-90% of your kidney function. It may also be done earlier if your symptoms may be improved by dialysis. During dialysis, wastes, salt, and extra water are removed from the blood, and the levels of certain chemicals in the blood (such as potassium) are maintained. Dialysis is done in sessions. Dialysis sessions are continued until the kidneys get better. If the kidneys cannot get better, such as in end-stage kidney disease, dialysis is continued for life or until you receive a new kidney (kidney transplant). There are two types of dialysis: hemodialysis and peritoneal dialysis.  WHAT IS HEMODIALYSIS?   Hemodialysis is a type of dialysis in which a machine called a dialyzer is used to filter the blood. Before beginning hemodialysis, you will have surgery to create a site where blood can be removed from the body and returned to the body (vascular access). There are three types of vascular accesses:  · Arteriovenous fistula. To create this type of access, an artery is connected to a vein (usually in the arm). A fistula takes 1-6 months to develop after surgery. If it develops properly, it usually lasts longer than the other types of vascular accesses. It is also less likely to become infected and cause blood clots.  · Arteriovenous graft. To create this type of access, an artery and a vein in the arm are connected with a tube. A graft may be used within 2-3 weeks of surgery.  · A venous catheter. To create this type of access, a thin, flexible tube (catheter) is placed in a large vein in your neck, chest, or groin. A catheter may be used right away. It is usually used as a temporary access when dialysis needs to begin immediately.  During hemodialysis, blood leaves the body through your access. It travels through a tube to the dialyzer, where it is filtered. The blood then returns to your body through  another tube.  Hemodialysis is usually performed by a health care provider at a hospital or dialysis center three times a week. Visits last about 3-4 hours. It may also be performed with the help of another person at home with training.   WHAT IS PERITONEAL DIALYSIS?  Peritoneal dialysis is a type of dialysis in which the thin lining of the abdomen (peritoneum) is used as a filter. Before beginning peritoneal dialysis, you will have surgery to place a catheter in your abdomen. The catheter will be used to transfer a fluid called dialysate to and from your abdomen. At the start of a session, your abdomen is filled with dialysate. During the session, wastes, salt, and extra water in the blood pass through the peritoneum and into the dialysate. The dialysate is drained from the body at the end of the session. The process of filling and draining the dialysate is called an exchange. Exchanges are repeated until you have used up all the dialysate for the day.  Peritoneal dialysis may be performed by you at home or at almost any other location. It is done every day. You may need up to five exchanges a day. The amount of time the dialysate is in your body between exchanges is called a dwell. The dwell depends on the number of exchanges needed and the characteristics of the peritoneum. It usually varies from 1.5-3 hours. You may go about your day normally between exchanges. Alternately, the exchanges may be done at night   while you sleep, using a machine called a cycler.  WHICH TYPE OF DIALYSIS SHOULD I CHOOSE?   Both hemodialysis and peritoneal dialysis have advantages and disadvantages. Talk to your health care provider about which type of dialysis would be best for you. Your lifestyle and preferences should be considered along with your medical condition. In some cases, only one type of dialysis may be an option.   Advantages of hemodialysis  · It is done less often than peritoneal dialysis.  · Someone else can do the  dialysis for you.  · If you go to a dialysis center, your health care provider will be able to recognize any problems right away.  · If you go to a dialysis center, you can interact with others who are having dialysis. This can provide you with emotional support.  Disadvantages of hemodialysis  · Hemodialysis may cause cramps and low blood pressure. It may leave you feeling tired on the days you have the treatment.  · If you go to a dialysis center, you will need to make weekly appointments and work around the center's schedule.  · You will need to take extra care when traveling. If you go to a dialysis center, you will need to make special arrangements to visit a dialysis center near your destination. If you are having treatments at home, you will need to take the dialyzer with you to your destination.  · You will need to avoid more foods than you would need to avoid on peritoneal dialysis.  Advantages of peritoneal dialysis  · It is less likely than hemodialysis to cause cramps and low blood pressure.  · You may do exchanges on your own wherever you are, including when you travel.  · You do not need to avoid as many foods as you do on hemodialysis.  Disadvantages of peritoneal dialysis  · It is done more often than hemodialysis.  · Performing peritoneal dialysis requires you to have dexterity of the hands. You must also be able to lift bags.  · You will have to learn sterilization techniques. You will need to practice them every day to reduce the risk of infection.   WHAT CHANGES WILL I NEED TO MAKE TO MY DIET DURING DIALYSIS?  Both hemodialysis and peritoneal dialysis require you to make some changes to your diet. For example, you will need to limit your intake of foods high in the minerals phosphorus and potassium. You will also need to limit your fluid intake. Your dietitian can help you plan meals. A good meal plan can improve your dialysis and your health.   WHAT SHOULD I EXPECT WHEN BEGINNING  DIALYSIS?  Adjusting to the dialysis treatment, schedule, and diet can take some time. You may need to stop working and may not be able to do some of the things you normally do. You may feel anxious or depressed when beginning dialysis. Eventually, many people feel better overall because of dialysis. Some people are able to return to work after making some changes, such as reducing work intensity.  WHERE CAN I FIND MORE INFORMATION?   · National Kidney Foundation: www.kidney.org  · American Association of Kidney Patients: www.aakp.org  · [employer]: www.kidneyfund.org  Document Released: 09/01/2002 Document Revised: 10/26/2013 Document Reviewed: 08/05/2012  [employer]® Patient Information ©2015 [employer]. This information is not intended to replace advice given to you by your health care provider. Make sure you discuss any questions you have with your health care provider.

## 2014-08-12 NOTE — ED Notes (Signed)
CRITICAL VALUE ALERT  Critical value received:  Potassium 6.4  Date of notification:  08/12/14  Time of notification:  9150  Critical value read back:Yes.    Nurse who received alert:  GM  MD notified (1st page): 1333  Time of first page:  1333  MD notified (2nd page):  Time of second page:  Responding MD:  DW  Time MD responded:  [phone]

## 2014-08-12 NOTE — ED Notes (Signed)
Pt has ambulated with assistance twice in ED

## 2014-08-12 NOTE — ED Provider Notes (Signed)
CSN: [id number]     Arrival date & time 08/12/14  1057 History  This chart was scribed for Sharyon Cable, MD by Edison Simon, ED Scribe. This patient was seen in room APA15/APA15 and the patient's care was started at 1:06 PM.    Chief Complaint  Patient presents with  . Emesis   Patient is a 34 y.o. male presenting with vomiting. The history is provided by the patient and a parent. No language interpreter was used.  Emesis Severity:  Mild Duration:  1 week Timing:  Intermittent Number of daily episodes:  4 counts in 1 week Emesis appearance: dark. Able to tolerate:  Liquids and solids Onset of vomiting after eating: seemingly unrelated to eating. Progression:  Unchanged Chronicity:  New Recent urination:  Absent Context: not post-tussive and not self-induced   Relieved by:  Nothing Worsened by:  Nothing tried Ineffective treatments:  None tried Associated symptoms: no abdominal pain, no diarrhea and no headaches   Risk factors comment:  Dialysis patient   HPI Comments: Cody Simon is a 34 y.o. male dialysis patient who presents to the Emergency Department complaining of vomiting; mother reports 4 counts since 1 week ago. When asked what makes him vomit, he states "something in my stomach I guess." Mother states he has no problems tolerating food. She states his emesis is reddish. She also states he has not been making urine for the past week; she states he is trying to void but cannot. She reports associated cough. He last had dialysis yesterday and is due tomorrow. He gets dialysis on [address] and has been on dialysis for 2 years. He denies fever, headache, chest pain, abdominal pain, diarrhea, or blood in stool. He denies pain anywhere.  PCP in [facility], moved here 2 weeks ago   Past Medical History  Diagnosis Date  . Renal disorder     dialysis  . Hypertension   . Lupus   . Brain tumor (benign)   . Personal history of disorder of nervous system and sense organs    . Convulsions   . Hydrocephalus   . Vertebral artery obstruction   . Bed wetting   . Stroke     bleed  . Blood clot in vein     traveling blood clot in arm  . Bleeding nose   . Personal history of abnormal accumulation of fluid in abdomen   . Brain injury   . Blood clotting disorder   . Defect of circulatory system   . Vertebral artery obstruction   . Low sodium levels   . Prolonged Q-T interval on ECG    Past Surgical History  Procedure Laterality Date  . Dialysis fistula creation    . Shunt in brain    . Cardiac surgery    . Cholecystectomy    . Eye surgery     No family history on file. History  Substance Use Topics  . Smoking status: Never Smoker   . Smokeless tobacco: Not on file  . Alcohol Use: No    Review of Systems  Constitutional: Negative for fever.  Respiratory: Positive for cough.   Cardiovascular: Negative for chest pain.  Gastrointestinal: Positive for nausea and vomiting. Negative for abdominal pain, diarrhea and blood in stool.  Genitourinary:       Not making urine  Neurological: Negative for headaches.  All other systems reviewed and are negative.     Allergies  Dronabinol; Lisinopril; and Other  Home Medications  Prior to Admission medications   Not on File   BP 107/58 mmHg  Pulse 93  Temp(Src) 98 F (36.7 C) (Oral)  Resp 20  Ht 5\' 7"  (1.702 m)  Wt 190 lb (86.183 kg)  BMI 29.75 kg/m2  SpO2 100% Physical Exam  Nursing note and vitals reviewed.  CONSTITUTIONAL: chronically ill appearing HEAD: Normocephalic/atraumatic EYES: EOMI/PERRL, icterus noted ENMT: Mucous membranes moist NECK: supple no meningeal signs SPINE/BACK:entire spine nontender CV: S1/S2 noted LUNGS:  no apparent distress, coarse breath sounds bilaterally ABDOMEN: soft, nontender, no rebound or guarding, bowel sounds noted throughout abdomen GU:no cva tenderness NEURO: Pt is awake/alert/appropriate, moves all extremitiesx4.  No facial droop.    EXTREMITIES: pulses normal/equal, full ROM SKIN: warm, color normal, Fistula in right arm with thrill PSYCH: no abnormalities of mood noted, alert and oriented to situation  ED Course  Procedures   DIAGNOSTIC STUDIES: Oxygen Saturation is 100% on room air, normal by my interpretation.    COORDINATION OF CARE: 1:14 PM Discussed treatment plan with patient at beside, the patient agrees with the plan and has no further questions at this time.  1:46 PM Discussed with patient and mother that his labs reveal evidence of elevated potassium level. Will consult with nephrologist. 2:04 PM D/w dr Lowanda Foster We discussed lab findings and EKG (K-6.4) No EKG changes noted He requests to give patient kayexalate (up to 30gm PO) If no other acute issues, he can be discharged to dialysis tomorrow if he can tolerate kayexalate  4:11 PM Pt stable in ED He has no abdominal tenderness No HA and he can ambulate (he has h/o shunt) Doubt acute neuro process No signs of acute abdominal emergency Other than hyperK without EKG changes no acute issues Will need outpatient workup for his vomiting He will have dialysis tomorrow Patient/mother agreeable with plan  Labs Review Labs Reviewed  CBC WITH DIFFERENTIAL/PLATELET - Abnormal; Notable for the following:    RBC 4.11 (*)    MCV 102.9 (*)    RDW 16.0 (*)    Eosinophils Relative 8 (*)    All other components within normal limits  BASIC METABOLIC PANEL - Abnormal; Notable for the following:    Potassium 6.4 (*)    Glucose, Bld 104 (*)    BUN 53 (*)    Creatinine, Ser 10.62 (*)    GFR calc non Af Amer 6 (*)    GFR calc Af Amer 6 (*)    All other components within normal limits  HEPATIC FUNCTION PANEL - Abnormal; Notable for the following:    Total Protein 8.8 (*)    AST 46 (*)    ALT 78 (*)    Alkaline Phosphatase 285 (*)    All other components within normal limits  LIPASE, BLOOD - Abnormal; Notable for the following:    Lipase 65 (*)    All  other components within normal limits    Imaging Review Dg Chest 2 View  08/12/2014   CLINICAL DATA:  Pt c/o weakness and vomiting x 1 wk. Pt stopped urinating 2 days ago. Last BM 2 days ago. Last dialysis treatment yesterday. Hx renal disorder, HTN, Lupus, stroke, brain tumor, cardiac surgery, nonsmoker  EXAM: CHEST  2 VIEW  COMPARISON:  None.  FINDINGS: There has been a previous median sternotomy. Cardiac silhouette is normal in size and configuration. Normal mediastinal and hilar contours. Vascular stent projects over the top of the aortic knob.  Clear lungs.  No pleural effusion or pneumothorax.  Bony  thorax is unremarkable.  IMPRESSION: No acute cardiopulmonary disease.   Electronically Signed   By: Lajean Manes M.D.   On: 08/12/2014 15:03    EKG Interpretation  Date/Time:  Thursday August 12 2014 13:19:23 EST Ventricular Rate:  95 PR Interval:  167 QRS Duration: 87 QT Interval:  340 QTC Calculation: 427 R Axis:   137 Text Interpretation:  Sinus rhythm Right atrial enlargement Right ventricular hypertrophy ST elev, probable normal early repol pattern No significant change since last tracing Confirmed by Christy Gentles  MD, Hamed Debella ([id number]) on 08/12/2014 1:43:26 PM  Medications  sodium polystyrene (KAYEXALATE) 15 GM/60ML suspension 15 g (not administered)  insulin aspart (novoLOG) injection 10 Units (10 Units Intravenous Given 08/12/14 1425)  dextrose 50 % solution 50 mL (50 mLs Intravenous Given 08/12/14 1425)  sodium polystyrene (KAYEXALATE) 15 GM/60ML suspension 15 g (15 g Oral Given 08/12/14 1426)     MDM   Final diagnoses:  Non-intractable vomiting with nausea, vomiting of unspecified type  Hyperkalemia  Chronic renal failure, unspecified stage  Cough    Nursing notes including past medical history and social history reviewed and considered in documentation xrays/imaging reviewed by myself and considered during evaluation Labs/vital reviewed myself and considered during  evaluation   I personally performed the services described in this documentation, which was scribed in my presence. The recorded information has been reviewed and is accurate.      Sharyon Cable, MD 08/12/14 [phone]

## 2014-08-12 NOTE — ED Notes (Signed)
Mother reports pt is a dialysis patient and has had vomiting x 1 week.  Reports stopped urinating 2 days ago.  Mother notified the dialysis staff.  Pt denies any pain.  Denies diarrhea, LBM was 2 days ago.  Pt says feels like he has to void but can't.

## 2014-08-13 ENCOUNTER — Encounter (HOSPITAL_COMMUNITY)
Admission: RE | Admit: 2014-08-13 | Discharge: 2014-08-13 | Disposition: A | Discharge status: Home / Self Care | Payer: Medicaid Other | Source: Other Acute Inpatient Hospital | Attending: Nephrology | Admitting: Nephrology

## 2014-08-13 DIAGNOSIS — E875 Hyperkalemia: Secondary | ICD-10-CM | POA: Insufficient documentation

## 2014-08-13 LAB — POTASSIUM: Potassium: 5.2 mmol/L — ABNORMAL HIGH (ref 3.5–5.1)

## 2014-08-16 ENCOUNTER — Encounter (HOSPITAL_COMMUNITY): Payer: Self-pay

## 2014-08-16 ENCOUNTER — Inpatient Hospital Stay (HOSPITAL_COMMUNITY)
Admission: EM | Admit: 2014-08-16 | Discharge: 2014-08-18 | DRG: 391 | Disposition: A | Discharge status: Home / Self Care | Payer: Medicaid Other | Attending: Internal Medicine | Admitting: Internal Medicine

## 2014-08-16 DIAGNOSIS — K7460 Unspecified cirrhosis of liver: Secondary | ICD-10-CM | POA: Diagnosis present

## 2014-08-16 DIAGNOSIS — Z952 Presence of prosthetic heart valve: Secondary | ICD-10-CM

## 2014-08-16 DIAGNOSIS — K2960 Other gastritis without bleeding: Secondary | ICD-10-CM | POA: Diagnosis present

## 2014-08-16 DIAGNOSIS — D649 Anemia, unspecified: Secondary | ICD-10-CM

## 2014-08-16 DIAGNOSIS — K7469 Other cirrhosis of liver: Secondary | ICD-10-CM

## 2014-08-16 DIAGNOSIS — Z8249 Family history of ischemic heart disease and other diseases of the circulatory system: Secondary | ICD-10-CM

## 2014-08-16 DIAGNOSIS — K2990 Gastroduodenitis, unspecified, without bleeding: Secondary | ICD-10-CM

## 2014-08-16 DIAGNOSIS — I120 Hypertensive chronic kidney disease with stage 5 chronic kidney disease or end stage renal disease: Secondary | ICD-10-CM | POA: Diagnosis present

## 2014-08-16 DIAGNOSIS — K209 Esophagitis, unspecified: Secondary | ICD-10-CM | POA: Diagnosis present

## 2014-08-16 DIAGNOSIS — R112 Nausea with vomiting, unspecified: Secondary | ICD-10-CM

## 2014-08-16 DIAGNOSIS — E875 Hyperkalemia: Secondary | ICD-10-CM | POA: Diagnosis present

## 2014-08-16 DIAGNOSIS — I10 Essential (primary) hypertension: Secondary | ICD-10-CM | POA: Diagnosis present

## 2014-08-16 DIAGNOSIS — S069XAA Unspecified intracranial injury with loss of consciousness status unknown, initial encounter: Secondary | ICD-10-CM | POA: Diagnosis present

## 2014-08-16 DIAGNOSIS — K920 Hematemesis: Secondary | ICD-10-CM

## 2014-08-16 DIAGNOSIS — R110 Nausea: Secondary | ICD-10-CM

## 2014-08-16 DIAGNOSIS — Z8673 Personal history of transient ischemic attack (TIA), and cerebral infarction without residual deficits: Secondary | ICD-10-CM

## 2014-08-16 DIAGNOSIS — S069X9A Unspecified intracranial injury with loss of consciousness of unspecified duration, initial encounter: Secondary | ICD-10-CM | POA: Diagnosis present

## 2014-08-16 DIAGNOSIS — Z992 Dependence on renal dialysis: Secondary | ICD-10-CM

## 2014-08-16 DIAGNOSIS — K2980 Duodenitis without bleeding: Secondary | ICD-10-CM | POA: Diagnosis present

## 2014-08-16 DIAGNOSIS — N186 End stage renal disease: Secondary | ICD-10-CM | POA: Diagnosis present

## 2014-08-16 DIAGNOSIS — K2970 Gastritis, unspecified, without bleeding: Secondary | ICD-10-CM | POA: Diagnosis present

## 2014-08-16 HISTORY — DX: Unspecified cirrhosis of liver: K74.60

## 2014-08-16 HISTORY — DX: Presence of prosthetic heart valve: Z95.2

## 2014-08-16 LAB — CBC WITH DIFFERENTIAL/PLATELET
BASOS ABS: 0 10*3/uL (ref 0.0–0.1)
BASOS PCT: 1 % (ref 0–1)
EOS ABS: 0.6 10*3/uL (ref 0.0–0.7)
EOS PCT: 7 % — AB (ref 0–5)
HEMATOCRIT: 35.1 % — AB (ref 39.0–52.0)
HEMOGLOBIN: 11.4 g/dL — AB (ref 13.0–17.0)
LYMPHS PCT: 16 % (ref 12–46)
Lymphs Abs: 1.4 10*3/uL (ref 0.7–4.0)
MCH: 32.9 pg (ref 26.0–34.0)
MCHC: 32.5 g/dL (ref 30.0–36.0)
MCV: 101.2 fL — ABNORMAL HIGH (ref 78.0–100.0)
MONO ABS: 0.4 10*3/uL (ref 0.1–1.0)
Monocytes Relative: 4 % (ref 3–12)
NEUTROS ABS: 6.1 10*3/uL (ref 1.7–7.7)
Neutrophils Relative %: 72 % (ref 43–77)
Platelets: 143 10*3/uL — ABNORMAL LOW (ref 150–400)
RBC: 3.47 MIL/uL — AB (ref 4.22–5.81)
RDW: 15.4 % (ref 11.5–15.5)
WBC: 8.6 10*3/uL (ref 4.0–10.5)

## 2014-08-16 LAB — COMPREHENSIVE METABOLIC PANEL
ALT: 52 U/L (ref 0–53)
AST: 26 U/L (ref 0–37)
Albumin: 3.4 g/dL — ABNORMAL LOW (ref 3.5–5.2)
Alkaline Phosphatase: 222 U/L — ABNORMAL HIGH (ref 39–117)
Anion gap: 10 (ref 5–15)
BUN: 81 mg/dL — ABNORMAL HIGH (ref 6–23)
CHLORIDE: 105 mmol/L (ref 96–112)
CO2: 21 mmol/L (ref 19–32)
Calcium: 7.4 mg/dL — ABNORMAL LOW (ref 8.4–10.5)
Creatinine, Ser: 14.38 mg/dL — ABNORMAL HIGH (ref 0.50–1.35)
GFR calc non Af Amer: 4 mL/min — ABNORMAL LOW (ref >90)
GFR, EST AFRICAN AMERICAN: 4 mL/min — AB (ref >90)
GLUCOSE: 81 mg/dL (ref 70–99)
Potassium: 6.6 mmol/L (ref 3.5–5.1)
SODIUM: 136 mmol/L (ref 135–145)
Total Bilirubin: 0.6 mg/dL (ref 0.3–1.2)
Total Protein: 7.6 g/dL (ref 6.0–8.3)

## 2014-08-16 LAB — CBC
HCT: 33.4 % — ABNORMAL LOW (ref 39.0–52.0)
HEMATOCRIT: 31.9 % — AB (ref 39.0–52.0)
HEMOGLOBIN: 10.8 g/dL — AB (ref 13.0–17.0)
Hemoglobin: 10.2 g/dL — ABNORMAL LOW (ref 13.0–17.0)
MCH: 32.5 pg (ref 26.0–34.0)
MCH: 32.7 pg (ref 26.0–34.0)
MCHC: 32 g/dL (ref 30.0–36.0)
MCHC: 32.3 g/dL (ref 30.0–36.0)
MCV: 101.6 fL — AB (ref 78.0–100.0)
MCV: 101.2 fL — AB (ref 78.0–100.0)
PLATELETS: 126 10*3/uL — AB (ref 150–400)
Platelets: 109 10*3/uL — ABNORMAL LOW (ref 150–400)
RBC: 3.14 MIL/uL — ABNORMAL LOW (ref 4.22–5.81)
RBC: 3.3 MIL/uL — ABNORMAL LOW (ref 4.22–5.81)
RDW: 15.7 % — ABNORMAL HIGH (ref 11.5–15.5)
RDW: 15.6 % — AB (ref 11.5–15.5)
WBC: 7.2 10*3/uL (ref 4.0–10.5)
WBC: 6 10*3/uL (ref 4.0–10.5)

## 2014-08-16 LAB — BASIC METABOLIC PANEL
ANION GAP: 10 (ref 5–15)
BUN: 79 mg/dL — ABNORMAL HIGH (ref 6–23)
CALCIUM: 7.5 mg/dL — AB (ref 8.4–10.5)
CHLORIDE: 105 mmol/L (ref 96–112)
CO2: 23 mmol/L (ref 19–32)
CREATININE: 14.29 mg/dL — AB (ref 0.50–1.35)
GFR calc Af Amer: 4 mL/min — ABNORMAL LOW (ref >90)
GFR calc non Af Amer: 4 mL/min — ABNORMAL LOW (ref >90)
Glucose, Bld: 90 mg/dL (ref 70–99)
POTASSIUM: 6.4 mmol/L — AB (ref 3.5–5.1)
Sodium: 138 mmol/L (ref 135–145)

## 2014-08-16 LAB — PROTIME-INR
INR: 0.95 (ref 0.00–1.49)
Prothrombin Time: 12.8 seconds (ref 11.6–15.2)

## 2014-08-16 LAB — APTT: aPTT: 32 seconds (ref 24–37)

## 2014-08-16 LAB — HEMOGLOBIN AND HEMATOCRIT, BLOOD
HCT: 31.2 % — ABNORMAL LOW (ref 39.0–52.0)
HEMOGLOBIN: 9.9 g/dL — AB (ref 13.0–17.0)

## 2014-08-16 LAB — MRSA PCR SCREENING: MRSA BY PCR: NEGATIVE

## 2014-08-16 MED ORDER — SODIUM CHLORIDE 0.9 % IV SOLN: 100.0000 mL | INTRAVENOUS | Status: DC | PRN

## 2014-08-16 MED ORDER — CINACALCET HCL 30 MG PO TABS
60.0000 mg | ORAL_TABLET | Freq: Every day | ORAL | Status: DC
  Administered 2014-08-16 – 2014-08-18 (×3): 60 mg via ORAL
  Filled 2014-08-16 (×4): qty 2

## 2014-08-16 MED ORDER — OXYCODONE HCL 5 MG PO TABS
5.0000 mg | ORAL_TABLET | ORAL | Status: DC | PRN
  Administered 2014-08-17: 5 mg via ORAL
  Filled 2014-08-16: qty 1

## 2014-08-16 MED ORDER — SODIUM CHLORIDE 0.9 % IV SOLN
1.0000 g | Freq: Once | INTRAVENOUS | Status: AC
  Administered 2014-08-16: 1 g via INTRAVENOUS
  Filled 2014-08-16: qty 10

## 2014-08-16 MED ORDER — CARVEDILOL 3.125 MG PO TABS
3.1250 mg | ORAL_TABLET | Freq: Two times a day (BID) | ORAL | Status: DC
  Administered 2014-08-16 – 2014-08-18 (×5): 3.125 mg via ORAL
  Filled 2014-08-16 (×5): qty 1

## 2014-08-16 MED ORDER — PENTAFLUOROPROP-TETRAFLUOROETH EX AERO: 1.0000 "application " | INHALATION_SPRAY | CUTANEOUS | Status: DC | PRN

## 2014-08-16 MED ORDER — DOCUSATE SODIUM 100 MG PO CAPS
100.0000 mg | ORAL_CAPSULE | Freq: Two times a day (BID) | ORAL | Status: DC
  Administered 2014-08-16 – 2014-08-18 (×5): 100 mg via ORAL
  Filled 2014-08-16 (×5): qty 1

## 2014-08-16 MED ORDER — LIDOCAINE-PRILOCAINE 2.5-2.5 % EX CREA: 1.0000 "application " | TOPICAL_CREAM | CUTANEOUS | Status: DC | PRN

## 2014-08-16 MED ORDER — NEPRO/CARBSTEADY PO LIQD: 237.0000 mL | ORAL | Status: DC | PRN

## 2014-08-16 MED ORDER — HEPARIN SODIUM (PORCINE) 1000 UNIT/ML DIALYSIS
1000.0000 [IU] | INTRAMUSCULAR | Status: DC | PRN
  Filled 2014-08-16: qty 1

## 2014-08-16 MED ORDER — SENNA 8.6 MG PO TABS
2.0000 | ORAL_TABLET | Freq: Every day | ORAL | Status: DC
  Administered 2014-08-16 – 2014-08-18 (×3): 17.2 mg via ORAL
  Filled 2014-08-16 (×3): qty 2

## 2014-08-16 MED ORDER — LACOSAMIDE 50 MG PO TABS
100.0000 mg | ORAL_TABLET | Freq: Two times a day (BID) | ORAL | Status: DC
  Administered 2014-08-16 – 2014-08-18 (×5): 100 mg via ORAL
  Filled 2014-08-16 (×5): qty 2

## 2014-08-16 MED ORDER — SODIUM CHLORIDE 0.9 % IV SOLN: INTRAVENOUS | Status: DC

## 2014-08-16 MED ORDER — ACETAMINOPHEN 650 MG RE SUPP: 650.0000 mg | Freq: Four times a day (QID) | RECTAL | Status: DC | PRN

## 2014-08-16 MED ORDER — SODIUM POLYSTYRENE SULFONATE 15 GM/60ML PO SUSP
30.0000 g | Freq: Once | ORAL | Status: AC
  Administered 2014-08-16: 30 g via RECTAL
  Filled 2014-08-16: qty 120

## 2014-08-16 MED ORDER — LEVETIRACETAM 500 MG PO TABS
500.0000 mg | ORAL_TABLET | Freq: Every day | ORAL | Status: DC
  Administered 2014-08-16 – 2014-08-17 (×2): 500 mg via ORAL
  Filled 2014-08-16 (×2): qty 1

## 2014-08-16 MED ORDER — ACETAMINOPHEN 325 MG PO TABS
650.0000 mg | ORAL_TABLET | Freq: Four times a day (QID) | ORAL | Status: DC | PRN
Start: 2014-08-16 — End: 2014-08-18

## 2014-08-16 MED ORDER — ONDANSETRON HCL 4 MG/2ML IJ SOLN
4.0000 mg | Freq: Once | INTRAMUSCULAR | Status: AC
  Administered 2014-08-16: 4 mg via INTRAVENOUS
  Filled 2014-08-16: qty 2

## 2014-08-16 MED ORDER — SODIUM BICARBONATE 8.4 % IV SOLN
50.0000 meq | Freq: Once | INTRAVENOUS | Status: AC
  Administered 2014-08-16: 50 meq via INTRAVENOUS
  Filled 2014-08-16: qty 50

## 2014-08-16 MED ORDER — OLANZAPINE 5 MG PO TBDP
15.0000 mg | ORAL_TABLET | Freq: Every day | ORAL | Status: DC
  Administered 2014-08-16 – 2014-08-17 (×2): 15 mg via ORAL
  Filled 2014-08-16 (×4): qty 3

## 2014-08-16 MED ORDER — ESCITALOPRAM OXALATE 10 MG PO TABS
10.0000 mg | ORAL_TABLET | Freq: Every day | ORAL | Status: DC
  Administered 2014-08-16 – 2014-08-18 (×3): 10 mg via ORAL
  Filled 2014-08-16 (×3): qty 1

## 2014-08-16 MED ORDER — RENA-VITE PO TABS
1.0000 | ORAL_TABLET | Freq: Every day | ORAL | Status: DC
  Administered 2014-08-16 – 2014-08-18 (×3): 1 via ORAL
  Filled 2014-08-16 (×4): qty 1

## 2014-08-16 MED ORDER — LIDOCAINE HCL (PF) 1 % IJ SOLN: 5.0000 mL | INTRAMUSCULAR | Status: DC | PRN

## 2014-08-16 MED ORDER — HEPARIN SODIUM (PORCINE) 5000 UNIT/ML IJ SOLN
5000.0000 [IU] | Freq: Three times a day (TID) | INTRAMUSCULAR | Status: AC
  Administered 2014-08-16: 5000 [IU] via SUBCUTANEOUS
  Filled 2014-08-16: qty 1

## 2014-08-16 MED ORDER — ONDANSETRON HCL 4 MG/2ML IJ SOLN: 4.0000 mg | Freq: Four times a day (QID) | INTRAMUSCULAR | Status: DC | PRN

## 2014-08-16 MED ORDER — ALTEPLASE 2 MG IJ SOLR
2.0000 mg | Freq: Once | INTRAMUSCULAR | Status: AC | PRN
  Filled 2014-08-16: qty 2

## 2014-08-16 MED ORDER — PANTOPRAZOLE SODIUM 40 MG IV SOLR
40.0000 mg | Freq: Two times a day (BID) | INTRAVENOUS | Status: DC
  Administered 2014-08-16 (×2): 40 mg via INTRAVENOUS
  Filled 2014-08-16 (×2): qty 40

## 2014-08-16 MED ORDER — LEVETIRACETAM 250 MG PO TABS
250.0000 mg | ORAL_TABLET | ORAL | Status: DC
  Administered 2014-08-16 – 2014-08-18 (×2): 250 mg via ORAL
  Filled 2014-08-16 (×2): qty 1

## 2014-08-16 MED ORDER — SEVELAMER CARBONATE 800 MG PO TABS
2400.0000 mg | ORAL_TABLET | Freq: Three times a day (TID) | ORAL | Status: DC
  Administered 2014-08-16 – 2014-08-17 (×4): 2400 mg via ORAL
  Filled 2014-08-16 (×6): qty 3

## 2014-08-16 MED ORDER — SODIUM CHLORIDE 0.9 % IJ SOLN
3.0000 mL | Freq: Two times a day (BID) | INTRAMUSCULAR | Status: DC
  Administered 2014-08-16 – 2014-08-18 (×5): 3 mL via INTRAVENOUS

## 2014-08-16 MED ORDER — SEVELAMER CARBONATE 800 MG PO TABS: 800.0000 mg | ORAL_TABLET | Freq: Two times a day (BID) | ORAL | Status: DC | PRN

## 2014-08-16 MED ORDER — AMLODIPINE BESYLATE 5 MG PO TABS
10.0000 mg | ORAL_TABLET | Freq: Every day | ORAL | Status: DC
  Administered 2014-08-16 – 2014-08-18 (×3): 10 mg via ORAL
  Filled 2014-08-16 (×3): qty 2

## 2014-08-16 NOTE — ED Notes (Signed)
Dr. Maudie Mercury, hospitalist aware that this patient has a working fistula in the right and left arms. Dr. Maudie Mercury approved for this patient to have an iv in his right hand.

## 2014-08-16 NOTE — Progress Notes (Signed)
UR completed 

## 2014-08-16 NOTE — Consult Note (Signed)
Referring Provider: Dr. Verlon Au Primary Care Physician:  Gaspar Cola, Dr. Edwina Barth  Primary Gastroenterologist:  Dr. Gala Romney   Date of Admission: 08/16/14 Date of Consultation: 08/16/14  Reason for Consultation:  Hematemesis   HPI:  Cody Simon is a 34 y.o. year old male with multiple medical issues, on dialysis, presenting with 2 week history of nausea and vomiting, with evidence of hematemesis prior to admission.   Vomiting started 2 weeks ago. Brought to hospital Thursday with hyperkalemia, went home. Potassium at that time 6.4. Provided kayexalate, had dialysis Friday. Dialysis schedule Monday, Wednesday, Friday. No abdominal pain. Vomiting only occuring at night, between 2-3 am. Mom noted dark red/brown emesis yesterday evening. Good appetite. No NSAIDs or aspirin powders. Notes intermittent reflux but no medication, usually two times a week. Mom states he gets "choked" while eating, believing he eats too fast. No melena. No hematochezia. Senna and Colace twice a day. Complains of right upper extremity "swollen" but not painful.   Review of outside records from [place] reveal likely cirrhotic liver, thought to be cardiogenic. Last imaging in 2014 via ultrasound.    Past Medical History  Diagnosis Date  . Renal disorder     dialysis  . Hypertension   . Lupus   . Brain tumor (benign)   . Personal history of disorder of nervous system and sense organs   . Convulsions   . Hydrocephalus   . Vertebral artery obstruction   . Bed wetting   . Stroke     bleed  . Blood clot in vein     traveling blood clot in arm  . Bleeding nose   . Personal history of abnormal accumulation of fluid in abdomen   . Brain injury   . Blood clotting disorder   . Defect of circulatory system   . Vertebral artery obstruction   . Low sodium levels   . Prolonged Q-T interval on ECG   . H/O mitral valve replacement   . Cirrhosis     per outside imaging at [facility]. last imaging in 2014.     Past  Surgical History  Procedure Laterality Date  . Dialysis fistula creation    . Shunt in brain      X 2   . Cardiac surgery      valve replacement (no anticoagulation needed)  . Cholecystectomy    . Eye surgery    . Brain surgery      age 40, records state "asleep for 1 year"     Prior to Admission medications   Medication Sig Start Date End Date Taking? Authorizing Provider  amLODipine (NORVASC) 10 MG tablet Take 10 mg by mouth daily.   Yes Historical Provider, MD  B Complex-C (B-COMPLEX WITH VITAMIN C) tablet Take 1 tablet by mouth daily.   Yes Historical Provider, MD  carvedilol (COREG) 3.125 MG tablet Take 3.125 mg by mouth 2 (two) times daily with a meal.   Yes Historical Provider, MD  cinacalcet (SENSIPAR) 60 MG tablet Take 60 mg by mouth daily.   Yes Historical Provider, MD  clindamycin (CLEOCIN T) 1 % external solution Apply 1 application topically 2 (two) times daily.   Yes Historical Provider, MD  clindamycin (CLINDAGEL) 1 % gel Apply 1 application topically daily.   Yes Historical Provider, MD  docusate sodium (COLACE) 100 MG capsule Take 100 mg by mouth 2 (two) times daily.   Yes Historical Provider, MD  Epoetin Alfa (PROCRIT IJ) Inject 7,000 Units  as directed 3 (three) times a week. On dialysis days.   Yes Historical Provider, MD  escitalopram (LEXAPRO) 10 MG tablet Take 10 mg by mouth daily.   Yes Historical Provider, MD  Lacosamide 100 MG TABS Take 1 tablet by mouth 2 (two) times daily.   Yes Historical Provider, MD  levETIRAcetam (KEPPRA) 250 MG tablet Take 250 mg by mouth 3 (three) times a week. On Monday, Wednesday, and Friday. Should take in the evening/afternoon after HD.   Yes Historical Provider, MD  levETIRAcetam (KEPPRA) 500 MG tablet Take 500 mg by mouth at bedtime.   Yes Historical Provider, MD  olanzapine zydis (ZYPREXA) 15 MG disintegrating tablet Take 15 mg by mouth at bedtime.   Yes Historical Provider, MD  oxyCODONE (OXY IR/ROXICODONE) 5 MG immediate release  tablet Take 5 mg by mouth every 4 (four) hours as needed for moderate pain.   Yes Historical Provider, MD  senna (SENOKOT) 8.6 MG TABS tablet Take 2 tablets by mouth daily.   Yes Historical Provider, MD  sevelamer carbonate (RENVELA) 800 MG tablet Take 800-2,400 mg by mouth 3 (three) times daily with meals. 3 tablets daily with a meal and 1 tablet with snacks.   Yes Historical Provider, MD    Current Facility-Administered Medications  Medication Dose Route Frequency Provider Last Rate Last Dose  . acetaminophen (TYLENOL) tablet 650 mg  650 mg Oral Q6H PRN Jani Gravel, MD       Or  . acetaminophen (TYLENOL) suppository 650 mg  650 mg Rectal Q6H PRN Jani Gravel, MD      . amLODipine (NORVASC) tablet 10 mg  10 mg Oral Daily Jani Gravel, MD   10 mg at 08/16/14 0948  . carvedilol (COREG) tablet 3.125 mg  3.125 mg Oral BID WC Jani Gravel, MD   3.125 mg at 08/16/14 0948  . cinacalcet (SENSIPAR) tablet 60 mg  60 mg Oral Q breakfast Jani Gravel, MD      . docusate sodium (COLACE) capsule 100 mg  100 mg Oral BID Jani Gravel, MD   100 mg at 08/16/14 0948  . escitalopram (LEXAPRO) tablet 10 mg  10 mg Oral Daily Jani Gravel, MD   10 mg at 08/16/14 0948  . heparin injection 5,000 Units  5,000 Units Subcutaneous 3 times per day Jani Gravel, MD      . lacosamide (VIMPAT) tablet 100 mg  100 mg Oral BID Jani Gravel, MD   100 mg at 08/16/14 0947  . levETIRAcetam (KEPPRA) tablet 250 mg  250 mg Oral Once per day on Mon Wed Fri Jani Gravel, MD      . levETIRAcetam (KEPPRA) tablet 500 mg  500 mg Oral QHS Jani Gravel, MD      . multivitamin (RENA-VIT) tablet 1 tablet  1 tablet Oral Daily Jani Gravel, MD   1 tablet at 08/16/14 [phone]  . OLANZapine zydis (ZYPREXA) disintegrating tablet 15 mg  15 mg Oral QHS Jani Gravel, MD      . ondansetron [facility]) injection 4 mg  4 mg Intravenous Q6H PRN Jani Gravel, MD      . oxyCODONE (Oxy IR/ROXICODONE) immediate release tablet 5 mg  5 mg Oral Q4H PRN Jani Gravel, MD      . pantoprazole (PROTONIX) injection 40  mg  40 mg Intravenous Q12H Jani Gravel, MD   40 mg at 08/16/14 0946  . senna (SENOKOT) tablet 17.2 mg  2 tablet Oral Daily Jani Gravel, MD   17.2 mg at 08/16/14 0948  .  sevelamer carbonate (RENVELA) tablet 2,400 mg  2,400 mg Oral TID WC Jani Gravel, MD   2,400 mg at 08/16/14 0948  . sevelamer carbonate (RENVELA) tablet 800 mg  800 mg Oral BID PRN Samuella Cota, MD      . sodium chloride 0.9 % injection 3 mL  3 mL Intravenous Q12H Jani Gravel, MD   3 mL at 08/16/14 0948    Allergies as of 08/16/2014 - Review Complete 08/16/2014  Allergen Reaction Noted  . Dronabinol  08/12/2014  . Lisinopril  08/12/2014  . Other  08/12/2014    Family History  Problem Relation Age of Onset  . Hypertension    . Colon cancer Neg Hx     History   Social History  . Marital Status: Single    Spouse Name: N/A  . Number of Children: N/A  . Years of Education: N/A   Occupational History  . Not on file.   Social History Main Topics  . Smoking status: Never Smoker   . Smokeless tobacco: Not on file  . Alcohol Use: No  . Drug Use: No  . Sexual Activity: Not on file   Other Topics Concern  . Not on file   Social History Narrative    Review of Systems: Gen: +chills  CV: Denies chest pain, heart palpitations, syncope, edema  Resp: Denies shortness of breath with rest, cough, wheezing GI: see HPI GU : Dialysis for 2.5 years MS: Denies joint pain,swelling, cramping Derm: Denies rash, itching, dry skin Psych: Denies depression, anxiety,confusion, or memory loss   Physical Exam: Vital signs in last 24 hours: Temp:  [93 F (33.9 C)-98.2 F (36.8 C)] 93 F (33.9 C) (02/22 0642) Pulse Rate:  [79-102] 79 (02/22 0949) Resp:  [20-22] 20 (02/22 0642) BP: (131-169)/(74-83) 158/74 mmHg (02/22 0949) SpO2:  [100 %] 100 % (02/22 0642) Weight:  [190 lb (86.183 kg)] 190 lb (86.183 kg) (02/22 8338)   General:   Alert,  Well-developed, well-nourished, pleasant and cooperative in NAD Head:  Normocephalic  and atraumatic. Eyes:  Sclera clear, no icterus.   Conjunctiva pink. Ears:  Normal auditory acuity. Mouth:  No deformity or lesions Lungs:  Clear throughout to auscultation.    Heart:  S1 S2 present, normal rhythm  Abdomen:  Soft, full, obese, non-tender. No tenderness to palpation Rectal:  Deferred  Msk:  Symmetrical without gross deformities. Normal posture. Extremities:  Without  Lower extremity edema. Right upper extremity at access site with ecchymosis, edema.  Neurologic:  Alert and  oriented x4. Slow to respond at times, asking to repeat questions occasionally Psych:  Alert and cooperative. Normal mood and affect.  Intake/Output from previous day:   Intake/Output this shift:    Lab Results:  Recent Labs  08/16/14 0406  WBC 8.6  HGB 11.4*  HCT 35.1*  PLT 143*   BMET  Recent Labs  08/13/14 1300 08/16/14 0406  NA  --  138  K 5.2* 6.4*  CL  --  105  CO2  --  23  GLUCOSE  --  90  BUN  --  79*  CREATININE  --  14.29*  CALCIUM  --  7.5*   PT/INR  Recent Labs  08/16/14 0406  LABPROT 12.8  INR 0.95   Ultrasound abdomen Nov 2014 at [facility]:  Cirrhotic liver, patent portal vein but with decreased flow veolcity, mild ascites.  CT abd/pelvis with contrast Sept 2014 at [facility]:  Large volume, multiloculated ascites with VP shunt terminating within the  fluid in the right pelvis, atrophic liver with probable findings of cardiogenic cirrhosis, mild jejunal wall thickening, severe cardiomegaly.   Impression: 34 year old male admitted with several week history of nocturnal nausea/vomiting, with recent episode of possible hematemesis; he has had a 3 gram drop in Hgb noted over the past several days (from prior ED visit to current admission). Chronic GERD noted without outpatient PPI. Hematemesis likely secondary to repetitive vomiting, esophagitis, gastritis, possible PUD, doubt variceal bleed. As of note, likely cirrhosis noted on outpatient imaging through [place], with last  imaging via ultrasound in 2014. Clinical presentation not consistent with concerns for variceal bleeding, and he has had no further evidence of overt GI bleeding since admission. Ultimately needs EGD with possible dilatation (due to "choking" with food), but this will need to be on hold until hyperkalemia is addressed. Dialysis likely today, with tentative EGD with Propofol tomorrow due to polypharmacy. Agree with PPI BID.   Cirrhosis: care as outpatient. LFTs mainly cholestatic pattern. Likely cardiogenic in nature, +/- fatty liver. Further work-up as outpatient to exclude other etiologies. US abdomen needed as outpatient.   Plan: PPI BID NPO after midnight Serial H/H Hold Heparin dose tomorrow morning Tentative EGD with Propofol on 2/23.  Monitor for further evidence of overt GI bleeding Hopeful dialysis today Management of hyperkalemia per attending Outpatient cirrhosis work-up   Orvil Feil, ANP-BC [facility]          08/16/2014, 9:50 AM  Attending note:  Patient seen and examined. Discussed situation at length with patient's mother at the bedside. Agree with above assessment and recommendations as outlined above. Senna and Colace not very effective lately in the management of constipation. Would consider adding MiraLAX to his regimen going forward.  It is understood that  Dr. Oneida Alar will be performing the procedure tomorrow in my absence. The risks, benefits, limitations, alternatives and imponderables have been reviewed with the patient. Potential for esophageal dilation, biopsy, etc. have also been reviewed.  Questions have been answered. All parties agreeable.

## 2014-08-16 NOTE — Progress Notes (Signed)
Patient returned from dialysis. Alert and oriented . Mother at bedside. Only complain is being hungry. Patient set up to eat dinner. Medications given as ordered. Vital signs stable. Consent for EGD signed by patients mother.

## 2014-08-16 NOTE — Progress Notes (Signed)
I agree with the History/assesment & plan per Midlevel provider Further details as follows:-        34 y/o ? 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 Note the patient has tolerated breakfast and lunch today without any further symptoms His hemoglobin and his hemoglobin has trended down from admission 13.9--10-9.9 DDX =? Esophagitis versus small variceal bleed-4 endoscopy a.m. nothing by mouth for procedure   we will need to figure out if his neurologist wishes him to be on growth hormone, Cortef as he does have a history of panhypopituitarism I did cc his primary care physician Dr. Sharee Pimple regarding the admission He will need significant care coordination as left dialysis access needs to be ligated   [facility] PROGRESS NOTE  Osiel R Little GEX:[id number] DOB: Mar 13, 1981 DOA: 08/16/2014 PCP: No PCP Per Patient  Assessment/Plan:  Hematemesis: nocuturnal nausea/vomitinig with possible hematemesis. Hg trending down some what. No NSAIDS or aspirin. No reported melena or BRBPR. Will check FOBT. Evaluated by GI who opine may be related to repetitive vomiting, esophagitis and/or gastritis. GI doubts variceal bleed. Recommending EGD when electrolytes corrected. No further episodes. Continue protonix  Hyperkalemia:  Secondary to? UGI bleed? Received calcium gluconate, and kayexalate  Compliant with dialysis. Mother reports compliance with diet. Schedule for dialysis today. Will monitor.     Anemia: concern for GI bleed given #1. Will obtain serial CBC. Appreciate GI assistance. Monitor    ESRD (end stage  renal disease): dialysis today. Appreciated nephrology assistance. New fistula upper right arm with some swelling and heat. Non tender. +briut.       Nausea & vomiting: reportedly occurs only at night. zofran as needed. Appetite good. Monitor      Hypertension: fair control. Home meds include norvasc, coreg. Will continue these.      Cirrhosis: appears stable at baseline  Code Status: full Family Communication: mother at bedside Disposition Plan: home when ready   Consultants:  Nephrology  GI  Procedures:  Dialysis 08/16/14  Antibiotics:  none  HPI/Subjective: Lying in bed. Denies pain/discomfort  Objective: Filed Vitals:   08/16/14 0949  BP: 158/74  Pulse: 79  Temp:   Resp:     Intake/Output Summary (Last 24 hours) at 08/16/14 1258 Last data filed at 08/16/14 1000  Gross per 24 hour  Intake    240 ml  Output      0 ml  Net    240 ml   Filed Weights   08/16/14 0348 08/16/14 0642  Weight: 86.183 kg (190 lb) 86.183 kg (190 lb)    Exam:   General:  Well nourished appears comfortable  Cardiovascular: RRR no MGR no LE edema  Respiratory: normal effort BS clear bilaterally no wheeze  Abdomen: obese soft +BS non-tender to palpation  Musculoskeletal: no clubbing or cyanosis. Right upper arm with dialysis fistula, healed scar, some warmth and swelling. No pain. Left arm fistula with healing scar.    Data Reviewed: Basic Metabolic Panel:  Recent Labs Lab 08/12/14 1236 08/13/14 1300 08/16/14 0406 08/16/14 0917  NA 138  --  138 136  K 6.4* 5.2* 6.4* 6.6*  CL 96  --  105 105  CO2 27  --  23 21  GLUCOSE 104*  --  90 81  BUN 53*  --  79* 81*  CREATININE 10.62*  --  14.29* 14.38*  CALCIUM 9.0  --  7.5* 7.4*   Liver Function Tests:  Recent Labs Lab 08/12/14 1257 08/16/14 0917  AST 46* 26  ALT 78* 52  ALKPHOS 285* 222*  BILITOT 0.6 0.6  PROT 8.8* 7.6  ALBUMIN 3.9 3.4*    Recent Labs Lab 08/12/14 1257  LIPASE 65*   No results for  input(s): AMMONIA in the last 168 hours. CBC:  Recent Labs Lab 08/12/14 1236 08/16/14 0406 08/16/14 0917 08/16/14 1102  WBC 8.8 8.6 7.2  --   NEUTROABS 5.5 6.1  --   --   HGB 13.9 11.4* 10.2* 9.9*  HCT 42.3 35.1* 31.9* 31.2*  MCV 102.9* 101.2* 101.6*  --   PLT 172 143* 126*  --    Cardiac Enzymes: No results for input(s): CKTOTAL, CKMB, CKMBINDEX, TROPONINI in the last 168 hours. BNP (last 3 results) No results for input(s): BNP in the last 8760 hours.  ProBNP (last 3 results) No results for input(s): PROBNP in the last 8760 hours.  CBG: No results for input(s): GLUCAP in the last 168 hours.  Recent Results (from the past 240 hour(s))  MRSA PCR Screening     Status: None   Collection Time: 08/16/14  6:35 AM  Result Value Ref Range Status   MRSA by PCR NEGATIVE NEGATIVE Final    Comment:        The GeneXpert MRSA Assay (FDA approved for NASAL specimens only), is one component of a comprehensive MRSA colonization surveillance program. It is not intended to diagnose MRSA infection nor to guide or monitor treatment for MRSA infections.      Studies: No results found.  Scheduled Meds: . amLODipine  10 mg Oral Daily  . carvedilol  3.125 mg Oral BID WC  . cinacalcet  60 mg Oral Q breakfast  . docusate sodium  100 mg Oral BID  . escitalopram  10 mg Oral Daily  . heparin  5,000 Units Subcutaneous 3 times per day  . lacosamide  100 mg Oral BID  . levETIRAcetam  250 mg Oral Once per day on Mon Wed Fri  . levETIRAcetam  500 mg Oral QHS  . multivitamin  1 tablet Oral Daily  . OLANZapine zydis  15 mg Oral QHS  . pantoprazole (PROTONIX) IV  40 mg Intravenous Q12H  . senna  2 tablet Oral Daily  . sevelamer carbonate  2,400 mg Oral TID WC  . sodium chloride  3 mL Intravenous Q12H   Continuous Infusions:       Time spent: 35 minutes    [facility] Pager [phone]. If 7PM-7AM, please contact night-coverage at [email or web address], password  [facility] 08/16/2014, 12:58 PM

## 2014-08-16 NOTE — Procedures (Signed)
   HEMODIALYSIS TREATMENT NOTE:  Pt's mother at bedside pre-HD asking, "Why did this happen?" Discussed pt's care plan without outpatient HD clinic charge RN.  Right upper arm AVF recently cannulated for first time.  Smaller gauge needle and slower blood flow rates without extension of treatment time could have contributed to hyperkalemia.  Pt's mother seems satisfied with this theory.  States pt is compliant with dialysis and renal diet.    Pt had two episodes of diarrhea prior to HD in response to kayexelate.  Completed 4 hours HD @ Qb 250.  AVF ready for 16g needles, however pt has difficulty keeping access arm still;  had to be reminded every few seconds not to move access arm.  Will require 1:1 dialysis care if dialyzed inpatient again due to high risk of infiltration.  Goal met:  Tolerated removal of 2.7 liters; no interruption in ultrafiltration.  All blood reinfused.  Hemostasis achieved in 12 minutes.  Report called to Jomarie Longs, RN.  Rockwell Alexandria, RN, CDN

## 2014-08-16 NOTE — H&P (Signed)
Cody Simon is an 34 y.o. male.    Dr. Kearney Hard (pcp, in [place]) [name] (nephrology)  Chief Complaint: n/v HPI: 34 yo male with hx of lupus, ESRD on HD (M, W, F) c/o n/v,  ? Blood, and c/o knot on the right arm.  The n/v started when he started dialysis 2 weeks ago.  ? Blood today and therefore his mother brought him to the ED for evaluation.  Pt denies cp, palp, sob, abd pain, diarrhea, brbpr, black stool.  Pt was noted to have high potassium and treated with calclium gluconate in Ed, and will be admitted for n/v, ? Hematemesis, and hyperkalemia.  Past Medical History  Diagnosis Date  . Renal disorder     dialysis  . Hypertension   . Lupus   . Brain tumor (benign)   . Personal history of disorder of nervous system and sense organs   . Convulsions   . Hydrocephalus   . Vertebral artery obstruction   . Bed wetting   . Stroke     bleed  . Blood clot in vein     traveling blood clot in arm  . Bleeding nose   . Personal history of abnormal accumulation of fluid in abdomen   . Brain injury   . Blood clotting disorder   . Defect of circulatory system   . Vertebral artery obstruction   . Low sodium levels   . Prolonged Q-T interval on ECG     Past Surgical History  Procedure Laterality Date  . Dialysis fistula creation    . Shunt in brain    . Cardiac surgery    . Cholecystectomy    . Eye surgery      Family History  Problem Relation Age of Onset  . Hypertension     Social History:  reports that he has never smoked. He does not have any smokeless tobacco history on file. He reports that he does not drink alcohol or use illicit drugs.  Allergies:  Allergies  Allergen Reactions  . Dronabinol     n/v  . Lisinopril     Cough   . Other     murinol     (Not in a hospital admission)  Results for orders placed or performed during the hospital encounter of 08/16/14 (from the past 48 hour(s))  Basic metabolic panel     Status: Abnormal   Collection Time:  08/16/14  4:06 AM  Result Value Ref Range   Sodium 138 135 - 145 mmol/L   Potassium 6.4 (HH) 3.5 - 5.1 mmol/L    Comment: CRITICAL RESULT CALLED TO, READ BACK BY AND VERIFIED WITH: YARBORO,S AT 4:45AM ON 08/16/14 BY FESTERMAN,C    Chloride 105 96 - 112 mmol/L   CO2 23 19 - 32 mmol/L   Glucose, Bld 90 70 - 99 mg/dL   BUN 79 (H) 6 - 23 mg/dL   Creatinine, Ser 14.29 (H) 0.50 - 1.35 mg/dL   Calcium 7.5 (L) 8.4 - 10.5 mg/dL   GFR calc non Af Amer 4 (L) >90 mL/min   GFR calc Af Amer 4 (L) >90 mL/min    Comment: (NOTE) The eGFR has been calculated using the CKD EPI equation. This calculation has not been validated in all clinical situations. eGFR's persistently <90 mL/min signify possible Chronic Kidney Disease.    Anion gap 10 5 - 15  CBC with Differential/Platelet     Status: Abnormal   Collection Time: 08/16/14  4:06 AM  Result Value Ref Range   WBC 8.6 4.0 - 10.5 K/uL   RBC 3.47 (L) 4.22 - 5.81 MIL/uL   Hemoglobin 11.4 (L) 13.0 - 17.0 g/dL   HCT 35.1 (L) 39.0 - 52.0 %   MCV 101.2 (H) 78.0 - 100.0 fL   MCH 32.9 26.0 - 34.0 pg   MCHC 32.5 30.0 - 36.0 g/dL   RDW 15.4 11.5 - 15.5 %   Platelets 143 (L) 150 - 400 K/uL   Neutrophils Relative % 72 43 - 77 %   Neutro Abs 6.1 1.7 - 7.7 K/uL   Lymphocytes Relative 16 12 - 46 %   Lymphs Abs 1.4 0.7 - 4.0 K/uL   Monocytes Relative 4 3 - 12 %   Monocytes Absolute 0.4 0.1 - 1.0 K/uL   Eosinophils Relative 7 (H) 0 - 5 %   Eosinophils Absolute 0.6 0.0 - 0.7 K/uL   Basophils Relative 1 0 - 1 %   Basophils Absolute 0.0 0.0 - 0.1 K/uL  Protime-INR     Status: None   Collection Time: 08/16/14  4:06 AM  Result Value Ref Range   Prothrombin Time 12.8 11.6 - 15.2 seconds   INR 0.95 0.00 - 1.49  APTT     Status: None   Collection Time: 08/16/14  4:06 AM  Result Value Ref Range   aPTT 32 24 - 37 seconds   No results found.  Review of Systems  Constitutional: Negative for fever, chills, weight loss, malaise/fatigue and diaphoresis.  HENT:  Negative for congestion, ear discharge, ear pain, hearing loss, nosebleeds, sore throat and tinnitus.   Eyes: Negative for blurred vision, double vision, photophobia, pain, discharge and redness.  Respiratory: Negative for cough, hemoptysis, sputum production, shortness of breath, wheezing and stridor.   Cardiovascular: Negative for chest pain, palpitations, orthopnea, claudication, leg swelling and PND.  Gastrointestinal: Positive for nausea and vomiting. Negative for heartburn, abdominal pain, diarrhea, constipation, blood in stool and melena.  Genitourinary: Negative for dysuria, urgency, frequency, hematuria and flank pain.  Musculoskeletal: Negative for myalgias, back pain, joint pain, falls and neck pain.  Skin: Negative for itching and rash.  Neurological: Negative for dizziness, tingling, tremors, sensory change, speech change, focal weakness, seizures, loss of consciousness, weakness and headaches.  Endo/Heme/Allergies: Negative for environmental allergies and polydipsia. Does not bruise/bleed easily.  Psychiatric/Behavioral: Negative for depression, suicidal ideas, hallucinations, memory loss and substance abuse. The patient is not nervous/anxious and does not have insomnia.     Blood pressure 169/81, pulse 102, temperature 98.2 F (36.8 C), temperature source Oral, resp. rate 22, height 5' 5"  (1.651 m), weight 86.183 kg (190 lb), SpO2 100 %. Physical Exam  Constitutional: He is oriented to person, place, and time. He appears well-developed and well-nourished.  HENT:  Head: Normocephalic and atraumatic.  Eyes: Conjunctivae and EOM are normal. Pupils are equal, round, and reactive to light. No scleral icterus.  Neck: Normal range of motion. Neck supple. No JVD present. No tracheal deviation present. No thyromegaly present.  Cardiovascular: Normal rate and regular rhythm.  Exam reveals no gallop and no friction rub.   No murmur heard. Respiratory: Effort normal and breath sounds  normal. No respiratory distress. He has no wheezes. He has no rales.  GI: Soft. Bowel sounds are normal. He exhibits no distension. There is no tenderness. There is no rebound and no guarding.  Musculoskeletal: Normal range of motion. He exhibits no edema or tenderness.  Lymphadenopathy:    He has no cervical adenopathy.  Neurological: He is alert and oriented to person, place, and time. He has normal reflexes. He displays normal reflexes. No cranial nerve deficit. He exhibits normal muscle tone. Coordination normal.  Skin: Skin is warm and dry. No rash noted. No erythema. No pallor.  Psychiatric: He has a normal mood and affect. His behavior is normal. Judgment and thought content normal.     Assessment/Plan N/v Zofran, check abdominal xray  ? Hematemesis protonix 24m iv bid Consider GI consultation in am  Hyperkalemia Sodium bicarbonate iv Calcium gluconate iv Kayexalate 30gm per rectum x1  Anemia Check cbc cmp this am  ESRD on HD, M, W, F Please consult nephrology this am for dialysis  KJani Gravel2/22/2016, 5:33 AM

## 2014-08-16 NOTE — ED Notes (Signed)
Pt vomited approx 30 mins ago and per pt's mother, had bright red blood in it.  Pt is dialysis patient, dialyzed on Friday.  Mother reports pt's right arm seems firm at the fistula site.

## 2014-08-16 NOTE — ED Notes (Signed)
CRITICAL VALUE ALERT  Critical value received:  Potassium 6.4  Date of notification:  08/16/2014  Time of notification:  0865  Critical value read back:Yes.    Nurse who received alert:  Lucy Antigua, RN  MD notified (1st page):  Dr. Florina Ou  Time of first page:  [phone]

## 2014-08-16 NOTE — Progress Notes (Signed)
Patient has IV access in right hand which is patients new dialysis graft sight. Discussed with Dyanne Carrel NP will leave Iv in until discussed with nephrology.
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
## 2014-08-16 NOTE — ED Provider Notes (Signed)
CSN: [id number]     Arrival date & time 08/16/14  6222 History   First MD Initiated Contact with Patient 08/16/14 [phone]     Chief Complaint  Patient presents with  . Hematemesis     (Consider location/radiation/quality/duration/timing/severity/associated sxs/prior Treatment) HPI  Level 5 Caveat: mentally challenged. This is a 34 year old male with end-stage renal disease and multiple other medical problems. He was brought in by his mother who found him vomiting in the bathroom just prior to arrival. She only saw him vomit once and is not sure how may times he vomited. The patient is unable to say how many times he vomited. The emesis is described as dark brown or brownish red. He has not had diarrhea. He is not having abdominal pain. He is not having shortness of breath. She is also concerned that his new dialysis fistula in his right upper arm is malfunctioning. She feels a knot in the middle of the fistula with surrounding ecchymosis. His older fistula in his left upper arm has not acutely changed.  He was seen in the ED on the 18th of this month for vomiting. He was diagnosed with hyperkalemia with potassium 6.4 and was given Kayexalate and followed up at dialysis.  Past Medical History  Diagnosis Date  . Renal disorder     dialysis  . Hypertension   . Lupus   . Brain tumor (benign)   . Personal history of disorder of nervous system and sense organs   . Convulsions   . Hydrocephalus   . Vertebral artery obstruction   . Bed wetting   . Stroke     bleed  . Blood clot in vein     traveling blood clot in arm  . Bleeding nose   . Personal history of abnormal accumulation of fluid in abdomen   . Brain injury   . Blood clotting disorder   . Defect of circulatory system   . Vertebral artery obstruction   . Low sodium levels   . Prolonged Q-T interval on ECG    Past Surgical History  Procedure Laterality Date  . Dialysis fistula creation    . Shunt in brain    . Cardiac surgery     . Cholecystectomy    . Eye surgery     No family history on file. History  Substance Use Topics  . Smoking status: Never Smoker   . Smokeless tobacco: Not on file  . Alcohol Use: No    Review of Systems  Unable to perform ROS   Allergies  Dronabinol; Lisinopril; and Other    EKG Interpretation  Date/Time:  Monday August 16 2014 04:51:18 EST Ventricular Rate:  90 PR Interval:  183 QRS Duration: 104 QT Interval:  372 QTC Calculation: 455 R Axis:   126 Text Interpretation:  Sinus rhythm Right atrial enlargement Right axis deviation No significant change was found Confirmed by Florina Ou  MD, Jenny Reichmann ([id number]) on 08/16/2014 4:54:19 AM       Home Medications   Prior to Admission medications   Medication Sig Start Date End Date Taking? Authorizing Provider  amLODipine (NORVASC) 10 MG tablet Take 10 mg by mouth daily.   Yes Historical Provider, MD  B Complex-C (B-COMPLEX WITH VITAMIN C) tablet Take 1 tablet by mouth daily.   Yes Historical Provider, MD  carvedilol (COREG) 3.125 MG tablet Take 3.125 mg by mouth 2 (two) times daily with a meal.   Yes Historical Provider, MD  cinacalcet (SENSIPAR) 60 MG tablet Take  60 mg by mouth daily.   Yes Historical Provider, MD  clindamycin (CLEOCIN T) 1 % external solution Apply 1 application topically 2 (two) times daily.   Yes Historical Provider, MD  clindamycin (CLINDAGEL) 1 % gel Apply 1 application topically daily.   Yes Historical Provider, MD  docusate sodium (COLACE) 100 MG capsule Take 100 mg by mouth 2 (two) times daily.   Yes Historical Provider, MD  Epoetin Alfa (PROCRIT IJ) Inject 7,000 Units as directed 3 (three) times a week. On dialysis days.   Yes Historical Provider, MD  escitalopram (LEXAPRO) 10 MG tablet Take 10 mg by mouth daily.   Yes Historical Provider, MD  Lacosamide 100 MG TABS Take 1 tablet by mouth 2 (two) times daily.   Yes Historical Provider, MD  levETIRAcetam (KEPPRA) 250 MG tablet Take 250 mg by mouth 3 (three)  times a week. On Monday, Wednesday, and Friday. Should take in the evening/afternoon after HD.   Yes Historical Provider, MD  levETIRAcetam (KEPPRA) 500 MG tablet Take 500 mg by mouth at bedtime.   Yes Historical Provider, MD  olanzapine zydis (ZYPREXA) 15 MG disintegrating tablet Take 15 mg by mouth at bedtime.   Yes Historical Provider, MD  OLANZapine zydis (ZYPREXA) 5 MG disintegrating tablet Take 5 mg by mouth at bedtime.   Yes Historical Provider, MD  oxyCODONE (OXY IR/ROXICODONE) 5 MG immediate release tablet Take 5 mg by mouth every 4 (four) hours as needed for moderate pain.   Yes Historical Provider, MD  senna (SENOKOT) 8.6 MG TABS tablet Take 2 tablets by mouth daily.   Yes Historical Provider, MD  sevelamer carbonate (RENVELA) 800 MG tablet Take 800-2,400 mg by mouth 3 (three) times daily with meals. 3 tablets daily with a meal and 1 tablet with snacks.   Yes Historical Provider, MD   BP 169/81 mmHg  Pulse 102  Temp(Src) 98.2 F (36.8 C) (Oral)  Resp 22  Ht 5\' 5"  (1.651 m)  Wt 190 lb (86.183 kg)  BMI 31.62 kg/m2  SpO2 100%   Physical Exam General: Well-developed, well-nourished male in no acute distress; appearance consistent with age of record HENT: normocephalic; atraumatic; pharynx normal Eyes: pupils equal, round and reactive to light; extraocular muscles intact Neck: supple Heart: regular rate and rhythm Lungs: clear to auscultation bilaterally Abdomen: soft; nondistended; nontender; bowel sounds present Extremities: No deformity; full range of motion; pulses normal; dialysis fistula left upper arm with pulse and thrill; dialysis fistula in right upper arm with surrounding ecchymosis, palpable clot in the center of the fistula but pulse and thrill in the distal and proximal aspects of the fistula Neurologic: Awake, alert; motor function intact in all extremities and symmetric; no facial droop Skin: Warm and dry Psychiatric: Normal mood and affect    ED Course   Procedures (including critical care time)   MDM  Nursing notes and vitals signs, including pulse oximetry, reviewed.  Summary of this visit's results, reviewed by myself:  Labs:  Results for orders placed or performed during the hospital encounter of 08/16/14 (from the past 24 hour(s))  Basic metabolic panel     Status: Abnormal   Collection Time: 08/16/14  4:06 AM  Result Value Ref Range   Sodium 138 135 - 145 mmol/L   Potassium 6.4 (HH) 3.5 - 5.1 mmol/L   Chloride 105 96 - 112 mmol/L   CO2 23 19 - 32 mmol/L   Glucose, Bld 90 70 - 99 mg/dL   BUN 79 (H) 6 -  23 mg/dL   Creatinine, Ser 14.29 (H) 0.50 - 1.35 mg/dL   Calcium 7.5 (L) 8.4 - 10.5 mg/dL   GFR calc non Af Amer 4 (L) >90 mL/min   GFR calc Af Amer 4 (L) >90 mL/min   Anion gap 10 5 - 15  CBC with Differential/Platelet     Status: Abnormal   Collection Time: 08/16/14  4:06 AM  Result Value Ref Range   WBC 8.6 4.0 - 10.5 K/uL   RBC 3.47 (L) 4.22 - 5.81 MIL/uL   Hemoglobin 11.4 (L) 13.0 - 17.0 g/dL   HCT 35.1 (L) 39.0 - 52.0 %   MCV 101.2 (H) 78.0 - 100.0 fL   MCH 32.9 26.0 - 34.0 pg   MCHC 32.5 30.0 - 36.0 g/dL   RDW 15.4 11.5 - 15.5 %   Platelets 143 (L) 150 - 400 K/uL   Neutrophils Relative % 72 43 - 77 %   Neutro Abs 6.1 1.7 - 7.7 K/uL   Lymphocytes Relative 16 12 - 46 %   Lymphs Abs 1.4 0.7 - 4.0 K/uL   Monocytes Relative 4 3 - 12 %   Monocytes Absolute 0.4 0.1 - 1.0 K/uL   Eosinophils Relative 7 (H) 0 - 5 %   Eosinophils Absolute 0.6 0.0 - 0.7 K/uL   Basophils Relative 1 0 - 1 %   Basophils Absolute 0.0 0.0 - 0.1 K/uL  Protime-INR     Status: None   Collection Time: 08/16/14  4:06 AM  Result Value Ref Range   Prothrombin Time 12.8 11.6 - 15.2 seconds   INR 0.95 0.00 - 1.49  APTT     Status: None   Collection Time: 08/16/14  4:06 AM  Result Value Ref Range   aPTT 32 24 - 37 seconds   [facility] to admit.     Wynetta Fines, MD 08/16/14 [phone]

## 2014-08-16 NOTE — Progress Notes (Signed)
CRITICAL VALUE ALERT  Critical value received:  Potassium 6.6  Date of notification: 08/16/2014  Time of notification:  1000  Critical value read back:yes  Nurse who received alert:  Jomarie Longs  MD notified (1st page):  Dyanne Carrel NP  Time of first page:  1000  MD notified (2nd page):  Time of second page:  Responding MD:  Dyanne Carrel  Time MD responded:  1000

## 2014-08-16 NOTE — Progress Notes (Signed)
Patient has IV line in right hand. Due to dialysis access in both arms nephrologist does not want IV in arms. PICC line nurse attempted to gain access in patients feet, but unable. Dr. Lowanda Foster notified. OK to leave in place if unable to obtain other IV access.

## 2014-08-17 ENCOUNTER — Inpatient Hospital Stay (HOSPITAL_COMMUNITY): Payer: Medicaid Other | Admitting: Anesthesiology

## 2014-08-17 ENCOUNTER — Encounter (HOSPITAL_COMMUNITY): Payer: Self-pay | Admitting: *Deleted

## 2014-08-17 ENCOUNTER — Encounter (HOSPITAL_COMMUNITY)
Admission: EM | Disposition: A | Discharge status: Home / Self Care | Payer: Self-pay | Source: Home / Self Care | Attending: Family Medicine

## 2014-08-17 DIAGNOSIS — K2970 Gastritis, unspecified, without bleeding: Secondary | ICD-10-CM

## 2014-08-17 DIAGNOSIS — K209 Esophagitis, unspecified: Principal | ICD-10-CM

## 2014-08-17 DIAGNOSIS — K2990 Gastroduodenitis, unspecified, without bleeding: Secondary | ICD-10-CM

## 2014-08-17 HISTORY — PX: BIOPSY: SHX5522

## 2014-08-17 HISTORY — PX: ESOPHAGOGASTRODUODENOSCOPY (EGD) WITH PROPOFOL: SHX5813

## 2014-08-17 LAB — KOH PREP: KOH Prep: NONE SEEN

## 2014-08-17 LAB — CBC
HCT: 34.2 % — ABNORMAL LOW (ref 39.0–52.0)
Hemoglobin: 11.1 g/dL — ABNORMAL LOW (ref 13.0–17.0)
MCH: 32.9 pg (ref 26.0–34.0)
MCHC: 32.5 g/dL (ref 30.0–36.0)
MCV: 101.5 fL — ABNORMAL HIGH (ref 78.0–100.0)
Platelets: 127 10*3/uL — ABNORMAL LOW (ref 150–400)
RBC: 3.37 MIL/uL — AB (ref 4.22–5.81)
RDW: 15.6 % — ABNORMAL HIGH (ref 11.5–15.5)
WBC: 5.7 10*3/uL (ref 4.0–10.5)

## 2014-08-17 LAB — BASIC METABOLIC PANEL
ANION GAP: 12 (ref 5–15)
BUN: 42 mg/dL — ABNORMAL HIGH (ref 6–23)
CALCIUM: 7.7 mg/dL — AB (ref 8.4–10.5)
CHLORIDE: 96 mmol/L (ref 96–112)
CO2: 28 mmol/L (ref 19–32)
Creatinine, Ser: 9.87 mg/dL — ABNORMAL HIGH (ref 0.50–1.35)
GFR calc non Af Amer: 6 mL/min — ABNORMAL LOW (ref >90)
GFR, EST AFRICAN AMERICAN: 7 mL/min — AB (ref >90)
Glucose, Bld: 83 mg/dL (ref 70–99)
Potassium: 4.7 mmol/L (ref 3.5–5.1)
Sodium: 136 mmol/L (ref 135–145)

## 2014-08-17 SURGERY — ESOPHAGOGASTRODUODENOSCOPY (EGD) WITH PROPOFOL
Anesthesia: Monitor Anesthesia Care

## 2014-08-17 MED ORDER — ALTEPLASE 2 MG IJ SOLR
2.0000 mg | Freq: Once | INTRAMUSCULAR | Status: AC | PRN
  Filled 2014-08-17: qty 2

## 2014-08-17 MED ORDER — LIDOCAINE VISCOUS 2 % MT SOLN
OROMUCOSAL | Status: AC
  Filled 2014-08-17: qty 15

## 2014-08-17 MED ORDER — MIDAZOLAM HCL 2 MG/2ML IJ SOLN
INTRAMUSCULAR | Status: AC
  Filled 2014-08-17: qty 2

## 2014-08-17 MED ORDER — SALINE SPRAY 0.65 % NA SOLN
1.0000 | NASAL | Status: DC | PRN
Start: 2014-08-17 — End: 2014-08-18
  Administered 2014-08-17: 1 via NASAL
  Filled 2014-08-17: qty 44

## 2014-08-17 MED ORDER — PANTOPRAZOLE SODIUM 40 MG PO TBEC
40.0000 mg | DELAYED_RELEASE_TABLET | Freq: Two times a day (BID) | ORAL | Status: DC
  Administered 2014-08-17 – 2014-08-18 (×3): 40 mg via ORAL
  Filled 2014-08-17 (×4): qty 1

## 2014-08-17 MED ORDER — FENTANYL CITRATE 0.05 MG/ML IJ SOLN: 25.0000 ug | INTRAMUSCULAR | Status: DC | PRN

## 2014-08-17 MED ORDER — GLYCOPYRROLATE 0.2 MG/ML IJ SOLN
INTRAMUSCULAR | Status: AC
  Filled 2014-08-17: qty 1

## 2014-08-17 MED ORDER — HEPARIN SODIUM (PORCINE) 1000 UNIT/ML DIALYSIS
1000.0000 [IU] | INTRAMUSCULAR | Status: DC | PRN
  Filled 2014-08-17: qty 1

## 2014-08-17 MED ORDER — ONDANSETRON HCL 4 MG/2ML IJ SOLN
INTRAMUSCULAR | Status: AC
  Filled 2014-08-17: qty 2

## 2014-08-17 MED ORDER — SODIUM CHLORIDE 0.9 % IV SOLN
INTRAVENOUS | Status: DC
  Administered 2014-08-17: 10 mL/h via INTRAVENOUS

## 2014-08-17 MED ORDER — GLYCOPYRROLATE 0.2 MG/ML IJ SOLN
0.2000 mg | Freq: Once | INTRAMUSCULAR | Status: AC
  Administered 2014-08-17: 0.2 mg via INTRAVENOUS

## 2014-08-17 MED ORDER — FENTANYL CITRATE 0.05 MG/ML IJ SOLN
INTRAMUSCULAR | Status: AC
  Filled 2014-08-17: qty 2

## 2014-08-17 MED ORDER — LIDOCAINE VISCOUS 2 % MT SOLN
15.0000 mL | Freq: Once | OROMUCOSAL | Status: AC
  Administered 2014-08-17: 15 mL via OROMUCOSAL

## 2014-08-17 MED ORDER — ONDANSETRON HCL 4 MG/2ML IJ SOLN
4.0000 mg | Freq: Once | INTRAMUSCULAR | Status: AC
  Administered 2014-08-17: 4 mg via INTRAVENOUS

## 2014-08-17 MED ORDER — ONDANSETRON HCL 4 MG/2ML IJ SOLN: 4.0000 mg | Freq: Once | INTRAMUSCULAR | Status: DC | PRN

## 2014-08-17 MED ORDER — SODIUM CHLORIDE 0.9 % IV SOLN: 100.0000 mL | INTRAVENOUS | Status: DC | PRN

## 2014-08-17 MED ORDER — MINERAL OIL PO OIL
TOPICAL_OIL | ORAL | Status: AC
  Filled 2014-08-17: qty 30

## 2014-08-17 MED ORDER — PROPOFOL INFUSION 10 MG/ML OPTIME
INTRAVENOUS | Status: DC | PRN
  Administered 2014-08-17: 125 ug/kg/min via INTRAVENOUS

## 2014-08-17 MED ORDER — FENTANYL CITRATE 0.05 MG/ML IJ SOLN
25.0000 ug | INTRAMUSCULAR | Status: DC
  Administered 2014-08-17 (×2): 25 ug via INTRAVENOUS

## 2014-08-17 MED ORDER — MIDAZOLAM HCL 2 MG/2ML IJ SOLN
1.0000 mg | INTRAMUSCULAR | Status: DC | PRN
  Administered 2014-08-17: 2 mg via INTRAVENOUS

## 2014-08-17 SURGICAL SUPPLY — 19 items
BLOCK BITE 60FR ADLT L/F BLUE (MISCELLANEOUS) ×2 IMPLANT
ELECT REM PT RETURN 9FT ADLT (ELECTROSURGICAL)
ELECTRODE REM PT RTRN 9FT ADLT (ELECTROSURGICAL) IMPLANT
FLOOR PAD 36X40 (MISCELLANEOUS) ×3
FORCEPS BIOP RAD 4 LRG CAP 4 (CUTTING FORCEPS) ×3 IMPLANT
FORMALIN 10 PREFIL 20ML (MISCELLANEOUS) ×4 IMPLANT
KIT CLEAN ENDO COMPLIANCE (KITS) ×2 IMPLANT
MANIFOLD NEPTUNE II (INSTRUMENTS) ×3 IMPLANT
NDL SCLEROTHERAPY 25GX240 (NEEDLE) IMPLANT
NEEDLE SCLEROTHERAPY 25GX240 (NEEDLE) IMPLANT
PAD FLOOR 36X40 (MISCELLANEOUS) ×1 IMPLANT
PROBE APC STR FIRE (PROBE) IMPLANT
PROBE INJECTION GOLD (MISCELLANEOUS)
PROBE INJECTION GOLD 7FR (MISCELLANEOUS) IMPLANT
SNARE SHORT THROW 13M SML OVAL (MISCELLANEOUS) IMPLANT
SYR 50ML LL SCALE MARK (SYRINGE) ×2 IMPLANT
SYR INFLATION 60ML (SYRINGE) IMPLANT
TUBING IRRIGATION ENDOGATOR (MISCELLANEOUS) ×2 IMPLANT
WATER STERILE IRR 1000ML POUR (IV SOLUTION) ×2 IMPLANT

## 2014-08-17 NOTE — Transfer of Care (Signed)
Immediate Anesthesia Transfer of Care Note  Patient: Cody Simon  Procedure(s) Performed: Procedure(s) with comments: ESOPHAGOGASTRODUODENOSCOPY WITH PROPOFOL (N/A) BIOPSY (N/A) - Gastric, Brush Biopsy of Esophagus  Patient Location: PACU  Anesthesia Type:MAC  Level of Consciousness: sedated  Airway & Oxygen Therapy: Patient Spontanous Breathing and non-rebreather face mask  Post-op Assessment: Report given to RN and Post -op Vital signs reviewed and stable  Post vital signs: Reviewed and stable   Complications: No apparent anesthesia complications

## 2014-08-17 NOTE — Anesthesia Postprocedure Evaluation (Signed)
  Anesthesia Post-op Note  Patient: Cody Simon  Procedure(s) Performed: Procedure(s) with comments: ESOPHAGOGASTRODUODENOSCOPY WITH PROPOFOL (N/A) BIOPSY (N/A) - Gastric, Brush Biopsy of Esophagus  Patient Location: PACU  Anesthesia Type:MAC  Level of Consciousness: awake and patient cooperative  Airway and Oxygen Therapy: Patient Spontanous Breathing and Patient connected to nasal cannula oxygen  Post-op Pain: none  Post-op Assessment: Post-op Vital signs reviewed, Patient's Cardiovascular Status Stable and Respiratory Function Stable  Post-op Vital Signs: Reviewed and stable  Last Vitals:  Filed Vitals:   08/17/14 1345  BP: 96/43  Pulse: 92  Temp:   Resp: 16    Complications: No apparent anesthesia complications

## 2014-08-17 NOTE — Progress Notes (Signed)
Subjective: Interval History: has no complaint of nausea or vomiting. And patient also does not have any difficulty in breathing. Still patient doesn't talk that much except few words..  Objective: Vital signs in last 24 hours: Temp:  [97.7 F (36.5 C)-98.7 F (37.1 C)] 97.7 F (36.5 C) (02/23 0657) Pulse Rate:  [68-99] 68 (02/23 0657) Resp:  [16-20] 16 (02/23 0657) BP: (132-172)/(65-82) 132/65 mmHg (02/23 0657) SpO2:  [98 %-100 %] 100 % (02/23 0657) Weight:  [87.1 kg (192 lb 0.3 oz)-87.8 kg (193 lb 9 oz)] 87.8 kg (193 lb 9 oz) (02/23 0657) Weight change: 0.917 kg (2 lb 0.3 oz)  Intake/Output from previous day: 02/22 0701 - 02/23 0700 In: 360 [P.O.:360] Out: 2700  Intake/Output this shift:    General appearance: alert, cooperative and no distress Resp: clear to auscultation bilaterally Cardio: regular rate and rhythm, S1, S2 normal, no murmur, click, rub or gallop GI: soft, non-tender; bowel sounds normal; no masses,  no organomegaly Extremities: extremities normal, atraumatic, no cyanosis or edema  Lab Results:  Recent Labs  08/16/14 2019 08/17/14 0628  WBC 6.0 5.7  HGB 10.8* 11.1*  HCT 33.4* 34.2*  PLT 109* 127*   BMET:  Recent Labs  08/16/14 0917 08/17/14 0628  NA 136 136  K 6.6* 4.7  CL 105 96  CO2 21 28  GLUCOSE 81 83  BUN 81* 42*  CREATININE 14.38* 9.87*  CALCIUM 7.4* 7.7*   No results for input(s): PTH in the last 72 hours. Iron Studies: No results for input(s): IRON, TIBC, TRANSFERRIN, FERRITIN in the last 72 hours.  Studies/Results: No results found.  I have reviewed the patient's current medications.  Assessment/Plan: Problem #1 end-stage renal disease status post hemodialysis yesterday. Patient presently asymptomatic. Problem #2 nausea, vomiting, hematemesis: Presently patient doesn't have any nausea or vomiting. He is for endoscopy today. Problem #3 anemia: His hemoglobin and hematocrit is better. Problem #4 hypertension: His blood  pressure is reasonably controlled Problem #5 metabolic bone disease: His calcium is a range. Presently patient is on a binder. Problem #6 history of brain injury  Problem #7 hyperkalemia his potassium has corrected Plan: We will make arrangements for patient to get dialysis tomorrow We'll check his CBC, basic metabolic panel and phosphorus in the morning.  LOS: 1 day   Cody Simon S 08/17/2014,8:40 AM

## 2014-08-17 NOTE — Progress Notes (Signed)
Patients mother called out saying patient had gotten his IV wet and needed a new dressing. Arrived to room and IV was already dislodged. Endo on floor to get patient for EGD. 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 They stated to send patient without IV.

## 2014-08-17 NOTE — Care Management Note (Addendum)
    Page 1 of 1   08/18/2014     3:03:34 PM CARE MANAGEMENT NOTE 08/18/2014  Patient:  Cody Simon, Cody Simon   Account Number:  [id number]  Date Initiated:  08/17/2014  Documentation initiated by:  Theophilus Kinds  Subjective/Objective Assessment:   Pt admitted from home with anemia and gi bleeding. Pt lives with his mother and will return home at discharge. Pt receives dialysis at [facility] in [place]. Per pts mother, pt requires mod assistance with ADL's.     Action/Plan:   Pts mother would like w/c for pt. Pts mother choses [place] for DME. Orders faxed and will be delivered to pts room prior to discharge.   Anticipated DC Date:  08/18/2014   Anticipated DC Plan:  HOME/SELF CARE      DC Planning Services  CM consult      PAC Choice  DURABLE MEDICAL EQUIPMENT   Choice offered to / List presented to:  C-2 HC POA / Guardian   DME arranged  [place]      DME agency  [facility]        Status of service:  Completed, signed off Medicare Important Message given?   (If response is "NO", the following Medicare IM given date fields will be blank) Date Medicare IM given:   Medicare IM given by:   Date Additional Medicare IM given:   Additional Medicare IM given by:    Discharge Disposition:  HOME/SELF CARE  Per UR Regulation:    If discussed at Long Length of Stay Meetings, dates discussed:    Comments:  08/18/14 [place], RN BSN CM Pt discharged home today. No CM needs noted. W/C delivered to pts room by [employer].  08/17/14 [place], RN BSN CM

## 2014-08-17 NOTE — H&P (Signed)
Primary Care Physician:  No PCP Per Patient Primary Gastroenterologist:  Dr. Oneida Alar  Pre-Procedure History & Physical: HPI:  Cody Simon is a 34 y.o. male here for DYSPHAGIA/HEMATEMESIS.  Past Medical History  Diagnosis Date  . Renal disorder     dialysis  . Hypertension   . Lupus   . Brain tumor (benign)   . Personal history of disorder of nervous system and sense organs   . Convulsions   . Hydrocephalus   . Vertebral artery obstruction   . Bed wetting   . Stroke     bleed  . Blood clot in vein     traveling blood clot in arm  . Bleeding nose   . Personal history of abnormal accumulation of fluid in abdomen   . Brain injury   . Blood clotting disorder   . Defect of circulatory system   . Vertebral artery obstruction   . Low sodium levels   . Prolonged Q-T interval on ECG   . H/O mitral valve replacement   . Cirrhosis     per outside imaging at [facility]. last imaging in 2014.     Past Surgical History  Procedure Laterality Date  . Dialysis fistula creation    . Shunt in brain      X 2   . Cardiac surgery      valve replacement (no anticoagulation needed)  . Cholecystectomy    . Eye surgery    . Brain surgery      age 20, records state "asleep for 1 year"     Prior to Admission medications   Medication Sig Start Date End Date Taking? Authorizing Provider  amLODipine (NORVASC) 10 MG tablet Take 10 mg by mouth daily.   Yes Historical Provider, MD  B Complex-C (B-COMPLEX WITH VITAMIN C) tablet Take 1 tablet by mouth daily.   Yes Historical Provider, MD  carvedilol (COREG) 3.125 MG tablet Take 3.125 mg by mouth 2 (two) times daily with a meal.   Yes Historical Provider, MD  cinacalcet (SENSIPAR) 60 MG tablet Take 60 mg by mouth daily.   Yes Historical Provider, MD  clindamycin (CLEOCIN T) 1 % external solution Apply 1 application topically 2 (two) times daily.   Yes Historical Provider, MD  clindamycin (CLINDAGEL) 1 % gel Apply 1 application topically daily.   Yes  Historical Provider, MD  docusate sodium (COLACE) 100 MG capsule Take 100 mg by mouth 2 (two) times daily.   Yes Historical Provider, MD  Epoetin Alfa (PROCRIT IJ) Inject 7,000 Units as directed 3 (three) times a week. On dialysis days.   Yes Historical Provider, MD  escitalopram (LEXAPRO) 10 MG tablet Take 10 mg by mouth daily.   Yes Historical Provider, MD  Lacosamide 100 MG TABS Take 1 tablet by mouth 2 (two) times daily.   Yes Historical Provider, MD  levETIRAcetam (KEPPRA) 250 MG tablet Take 250 mg by mouth 3 (three) times a week. On Monday, Wednesday, and Friday. Should take in the evening/afternoon after HD.   Yes Historical Provider, MD  levETIRAcetam (KEPPRA) 500 MG tablet Take 500 mg by mouth at bedtime.   Yes Historical Provider, MD  olanzapine zydis (ZYPREXA) 15 MG disintegrating tablet Take 15 mg by mouth at bedtime.   Yes Historical Provider, MD  oxyCODONE (OXY IR/ROXICODONE) 5 MG immediate release tablet Take 5 mg by mouth every 4 (four) hours as needed for moderate pain.   Yes Historical Provider, MD  senna (SENOKOT) 8.6 MG TABS tablet  Take 2 tablets by mouth daily.   Yes Historical Provider, MD  sevelamer carbonate (RENVELA) 800 MG tablet Take 800-2,400 mg by mouth 3 (three) times daily with meals. 3 tablets daily with a meal and 1 tablet with snacks.   Yes Historical Provider, MD    Allergies as of 08/16/2014 - Review Complete 08/16/2014  Allergen Reaction Noted  . Dronabinol  08/12/2014  . Lisinopril  08/12/2014  . Other  08/12/2014    Family History  Problem Relation Age of Onset  . Hypertension    . Colon cancer Neg Hx     History   Social History  . Marital Status: Single    Spouse Name: N/A  . Number of Children: N/A  . Years of Education: N/A   Occupational History  . Not on file.   Social History Main Topics  . Smoking status: Never Smoker   . Smokeless tobacco: Not on file  . Alcohol Use: No  . Drug Use: No  . Sexual Activity: Not on file   Other  Topics Concern  . Not on file   Social History Narrative    Review of Systems: See HPI, otherwise negative ROS   Physical Exam: BP 129/75 mmHg  Pulse 93  Temp(Src) 97.6 F (36.4 C) (Oral)  Resp 20  Ht 5\' 7"  (1.702 m)  Wt 193 lb 9 oz (87.8 kg)  BMI 30.31 kg/m2  SpO2 92% General:   Alert,  pleasant and cooperative in NAD Head:  Normocephalic and atraumatic. Neck:  Supple; Lungs:  Clear throughout to auscultation.    Heart:  Regular rate and rhythm. Abdomen:  Soft, nontender and nondistended. Normal bowel sounds, without guarding, and without rebound.   Neurologic:  Alert and  oriented x4;  grossly normal neurologically.  Impression/Plan:    DYSPHAGIA/HEMATEMESIS  PLAN:  EGD/?DIL TODAY

## 2014-08-17 NOTE — Op Note (Addendum)
[facility] [address] [place], [address]   ENDOSCOPY PROCEDURE REPORT  PATIENT: Cody Simon, Cody Simon  MR#: [id number] BIRTHDATE: Sep 27, 1980 , 33  yrs. old GENDER: male  ENDOSCOPIST: Danie Binder, MD REFERRED BY:Rex Dancel, M.D. PROCEDURE DATE: Aug 31, 2014 PROCEDURE:   EGD w/ biopsy INDICATIONS:hematemesis.   vomiting. MEDICATIONS: Monitored anesthesia care TOPICAL ANESTHETIC:   Viscous Xylocaine ASA CLASS:  DESCRIPTION OF PROCEDURE:     Physical exam was performed.  Informed consent was obtained from the patient after explaining the benefits, risks, and alternatives to the procedure.  The patient was connected to the monitor and placed in the left lateral position.  Continuous oxygen was provided by nasal cannula and IV medicine administered through an indwelling cannula.  After administration of sedation, the patients esophagus was intubated and the     endoscope was advanced under direct visualization to the second portion of the duodenum.  The scope was removed slowly by carefully examining the color, texture, anatomy, and integrity of the mucosa on the way out.  The patient was recovered in endoscopy and discharged home in satisfactory condition.   ESOPHAGUS: TWO LINEAR EROSIONS IN DISTAL ESOPHAGUS ASSOCIATED WITH EXUDATE.  BRUSH BIOPSIES OBTAINED. NO ESOPHAGEAL VARICES. STOMACH: Mild erosive gastritis (inflammation) was found in the gastric antrum.  Multiple biopsies were performed using cold forceps.   NO VARICES IN THE FUNDUS OR CARDIA. DUODENUM: Mild duodenal inflammation was found in the duodenal bulb.   The duodenal mucosa showed no abnormalities in the 2nd part of the duodenum. COMPLICATIONS: There were no immediate complications.  ENDOSCOPIC IMPRESSION: 1.   HEMATEMESIS DUE TO ESOPHAGITIS 2.   MILD GASTRITIS/DUODENITIS  RECOMMENDATIONS: AWAIT BIOPSY LOW FAT DIET BID PPI CONSIDER GES IF VOMITING CONTINUES  REPEAT  EXAM:   _______________________________ eSignedDanie Binder, MD August 31, 2014 3:13 PM     CPT CODES: ICD CODES:  The ICD and CPT codes recommended by this software are interpretations from the data that the clinical staff has captured with the software.  The verification of the translation of this report to the ICD and CPT codes and modifiers is the sole responsibility of the health care institution and practicing physician where this report was generated.  [employer] will not be held responsible for the validity of the ICD and CPT codes included on this report.  AMA assumes no liability for data contained or not contained herein. CPT is a Designer, television/film set of the [employer].

## 2014-08-17 NOTE — Anesthesia Preprocedure Evaluation (Signed)
Anesthesia Evaluation  Patient identified by MRN, date of birth, ID band Patient awake    Reviewed: Allergy & Precautions, NPO status , Patient's Chart, lab work & pertinent test results  Airway Mallampati: III  TM Distance: >3 FB     Dental  (+) Poor Dentition   Pulmonary neg pulmonary ROS,  Sinus congestion  breath sounds clear to auscultation        Cardiovascular hypertension, Pt. on medications + Peripheral Vascular Disease + Valvular Problems/Murmurs Rhythm:Regular Rate:Normal     Neuro/Psych Seizures -,  Benign brain tumor CVA    GI/Hepatic hematemesis    Endo/Other    Renal/GU Dialysis and ESRFRenal disease     Musculoskeletal   Abdominal   Peds  Hematology  (+) anemia ,   Anesthesia Other Findings   Reproductive/Obstetrics                             Anesthesia Physical Anesthesia Plan  ASA: III  Anesthesia Plan: MAC   Post-op Pain Management:    Induction: Intravenous  Airway Management Planned: Simple Face Mask  Additional Equipment:   Intra-op Plan:   Post-operative Plan:   Informed Consent: I have reviewed the patients History and Physical, chart, labs and discussed the procedure including the risks, benefits and alternatives for the proposed anesthesia with the patient or authorized representative who has indicated his/her understanding and acceptance.     Plan Discussed with:   Anesthesia Plan Comments:         Anesthesia Quick Evaluation

## 2014-08-17 NOTE — Progress Notes (Signed)
[facility] PROGRESS NOTE  Cody Simon GUR:[id number] DOB: 06-15-1981 DOA: 08/16/2014 PCP: No PCP Per Patient  Assessment/Plan: Hematemesis: nocuturnal nausea/vomitinig with possible hematemesis. Hg stable. No nocturnal emesis last night.  No NSAIDS or aspirin. No reported melena or BRBPR. Await FOBT. Evaluated by GI who opine may be related to repetitive vomiting, esophagitis and/or gastritis. GI doubts variceal bleed. EGD today. Continue protonix  Hyperkalemia: resolved.  Compliant with dialysis. Mother reports compliance with diet. Schedule for dialysis tomorrow. Will monitor.    Anemia: concern for GI bleed given #1. Stable. Appreciate GI assistance. Monitor    ESRD (end stage renal disease): dialysis yesterday and planned for tomorrow before hopefull discharge. Appreciated nephrology assistance. New fistula upper right arm with some swelling and heat. Non tender. +briut.     Nausea & vomiting: reportedly occurs only at night. No emesis last night. Appetite good. Monitor    Hypertension: fair control. Home meds include norvasc, coreg. Will continue these.   Cirrhosis: appears stable at baseline  Code Status: full Family Communication: mother at bedside Disposition Plan: hopefully tomorrow after dialysis   Consultants:  GI  nephrology  Procedures:  Dialysis 08/16/14  EGD 08/17/14  Dialysis 08/18/14  Antibiotics:  none  HPI/Subjective: Very sleepy this am. Denies pain/discomfort  Objective: Filed Vitals:   08/17/14 1240  BP: 105/64  Pulse:   Temp:   Resp: 19    Intake/Output Summary (Last 24 hours) at 08/17/14 1339 Last data filed at 08/17/14 1324  Gross per 24 hour  Intake    320 ml  Output   2700 ml  Net  -2380 ml   Filed Weights   08/16/14 0642 08/16/14 1255 08/17/14 0657  Weight: 86.183 kg (190 lb) 87.1 kg (192 lb 0.3 oz) 87.8 kg (193 lb 9 oz)    Exam:   General:  Well nourished no acute distress  Cardiovascular:  tachycardic but regular no MGR no LE edema PPP  Respiratory: normal effort BS clear to auscultation bilaterally no wheeze  Abdomen: obese soft +BS non-distended non-tender  Musculoskeletal: joints without swelling/erythema. Right upper arm with somewhat less swelling at fistula sit, some discoloration and heat remain. Left arm fisutla with healing scar   Data Reviewed: Basic Metabolic Panel:  Recent Labs Lab 08/12/14 1236 08/13/14 1300 08/16/14 0406 08/16/14 0917 08/17/14 0628  NA 138  --  138 136 136  K 6.4* 5.2* 6.4* 6.6* 4.7  CL 96  --  105 105 96  CO2 27  --  23 21 28   GLUCOSE 104*  --  90 81 83  BUN 53*  --  79* 81* 42*  CREATININE 10.62*  --  14.29* 14.38* 9.87*  CALCIUM 9.0  --  7.5* 7.4* 7.7*   Liver Function Tests:  Recent Labs Lab 08/12/14 1257 08/16/14 0917  AST 46* 26  ALT 78* 52  ALKPHOS 285* 222*  BILITOT 0.6 0.6  PROT 8.8* 7.6  ALBUMIN 3.9 3.4*    Recent Labs Lab 08/12/14 1257  LIPASE 65*   No results for input(s): AMMONIA in the last 168 hours. CBC:  Recent Labs Lab 08/12/14 1236 08/16/14 0406 08/16/14 0917 08/16/14 1102 08/16/14 2019 08/17/14 0628  WBC 8.8 8.6 7.2  --  6.0 5.7  NEUTROABS 5.5 6.1  --   --   --   --   HGB 13.9 11.4* 10.2* 9.9* 10.8* 11.1*  HCT 42.3 35.1* 31.9* 31.2* 33.4* 34.2*  MCV 102.9* 101.2* 101.6*  --  101.2* 101.5*  PLT 172  143* 126*  --  109* 127*   Cardiac Enzymes: No results for input(s): CKTOTAL, CKMB, CKMBINDEX, TROPONINI in the last 168 hours. BNP (last 3 results) No results for input(s): BNP in the last 8760 hours.  ProBNP (last 3 results) No results for input(s): PROBNP in the last 8760 hours.  CBG: No results for input(s): GLUCAP in the last 168 hours.  Recent Results (from the past 240 hour(s))  MRSA PCR Screening     Status: None   Collection Time: 08/16/14  6:35 AM  Result Value Ref Range Status   MRSA by PCR NEGATIVE NEGATIVE Final    Comment:        The GeneXpert MRSA Assay  (FDA approved for NASAL specimens only), is one component of a comprehensive MRSA colonization surveillance program. It is not intended to diagnose MRSA infection nor to guide or monitor treatment for MRSA infections.      Studies: No results found.  Scheduled Meds: . [MAR Hold] amLODipine  10 mg Oral Daily  . [MAR Hold] carvedilol  3.125 mg Oral BID WC  . [MAR Hold] cinacalcet  60 mg Oral Q breakfast  . [MAR Hold] docusate sodium  100 mg Oral BID  . [MAR Hold] escitalopram  10 mg Oral Daily  . [MAR Hold] lacosamide  100 mg Oral BID  . [MAR Hold] levETIRAcetam  250 mg Oral Once per day on Mon Wed Fri  . [MAR Hold] levETIRAcetam  500 mg Oral QHS  . mineral oil      . [MAR Hold] multivitamin  1 tablet Oral Daily  . [MAR Hold] OLANZapine zydis  15 mg Oral QHS  . [MAR Hold] pantoprazole  40 mg Oral BID AC  . [MAR Hold] senna  2 tablet Oral Daily  . [MAR Hold] sevelamer carbonate  2,400 mg Oral TID WC  . [MAR Hold] sodium chloride  3 mL Intravenous Q12H   Continuous Infusions: . sodium chloride    . sodium chloride 10 mL/hr (08/17/14 1128)    Active Problems:   Hematemesis   ESRD (end stage renal disease)   Hyperkalemia   Anemia   Nausea & vomiting   Other cirrhosis of liver   Hypertension   Brain injury   Cirrhosis    Time spent: [facility] Pager [phone]. If 7PM-7AM, please contact night-coverage at [email or web address], password [facility] 08/17/2014, 1:39 PM  LOS: 1 day

## 2014-08-18 ENCOUNTER — Encounter (HOSPITAL_COMMUNITY): Payer: Self-pay | Admitting: Gastroenterology

## 2014-08-18 DIAGNOSIS — K2990 Gastroduodenitis, unspecified, without bleeding: Secondary | ICD-10-CM

## 2014-08-18 DIAGNOSIS — K920 Hematemesis: Secondary | ICD-10-CM | POA: Diagnosis present

## 2014-08-18 DIAGNOSIS — K7460 Unspecified cirrhosis of liver: Secondary | ICD-10-CM

## 2014-08-18 DIAGNOSIS — K2970 Gastritis, unspecified, without bleeding: Secondary | ICD-10-CM | POA: Diagnosis present

## 2014-08-18 LAB — CBC
HCT: 32.7 % — ABNORMAL LOW (ref 39.0–52.0)
HEMOGLOBIN: 10.3 g/dL — AB (ref 13.0–17.0)
MCH: 32.1 pg (ref 26.0–34.0)
MCHC: 31.5 g/dL (ref 30.0–36.0)
MCV: 101.9 fL — AB (ref 78.0–100.0)
Platelets: 129 10*3/uL — ABNORMAL LOW (ref 150–400)
RBC: 3.21 MIL/uL — ABNORMAL LOW (ref 4.22–5.81)
RDW: 15.4 % (ref 11.5–15.5)
WBC: 7 10*3/uL (ref 4.0–10.5)

## 2014-08-18 LAB — BASIC METABOLIC PANEL
ANION GAP: 14 (ref 5–15)
BUN: 62 mg/dL — ABNORMAL HIGH (ref 6–23)
CHLORIDE: 97 mmol/L (ref 96–112)
CO2: 27 mmol/L (ref 19–32)
CREATININE: 12.43 mg/dL — AB (ref 0.50–1.35)
Calcium: 7.9 mg/dL — ABNORMAL LOW (ref 8.4–10.5)
GFR calc non Af Amer: 5 mL/min — ABNORMAL LOW (ref >90)
GFR, EST AFRICAN AMERICAN: 5 mL/min — AB (ref >90)
Glucose, Bld: 81 mg/dL (ref 70–99)
POTASSIUM: 5.4 mmol/L — AB (ref 3.5–5.1)
Sodium: 138 mmol/L (ref 135–145)

## 2014-08-18 LAB — PHOSPHORUS: Phosphorus: 6.8 mg/dL — ABNORMAL HIGH (ref 2.3–4.6)

## 2014-08-18 MED ORDER — PANTOPRAZOLE SODIUM 40 MG PO TBEC: 40.0000 mg | DELAYED_RELEASE_TABLET | Freq: Two times a day (BID) | ORAL | Status: AC

## 2014-08-18 NOTE — Progress Notes (Signed)
Cody Simon discharged home with mother per MD order.  Discharge instructions reviewed and discussed with the patient's mother at the bedside, all questions and concerns answered. Copy of instructions and scripts given to patient's mother.    Medication List    TAKE these medications        amLODipine 10 MG tablet  Commonly known as:  NORVASC  Take 10 mg by mouth daily.     B-complex with vitamin C tablet  Take 1 tablet by mouth daily.     carvedilol 3.125 MG tablet  Commonly known as:  COREG  Take 3.125 mg by mouth 2 (two) times daily with a meal.     cinacalcet 60 MG tablet  Commonly known as:  SENSIPAR  Take 60 mg by mouth daily.     clindamycin 1 % external solution  Commonly known as:  CLEOCIN T  Apply 1 application topically 2 (two) times daily.     docusate sodium 100 MG capsule  Commonly known as:  COLACE  Take 100 mg by mouth 2 (two) times daily.     escitalopram 10 MG tablet  Commonly known as:  LEXAPRO  Take 10 mg by mouth daily.     Lacosamide 100 MG Tabs  Take 1 tablet by mouth 2 (two) times daily.     levETIRAcetam 250 MG tablet  Commonly known as:  KEPPRA  Take 250 mg by mouth 3 (three) times a week. On Monday, Wednesday, and Friday. Should take in the evening/afternoon after HD.     levETIRAcetam 500 MG tablet  Commonly known as:  KEPPRA  Take 500 mg by mouth at bedtime.     olanzapine zydis 15 MG disintegrating tablet  Commonly known as:  ZYPREXA  Take 15 mg by mouth at bedtime.     oxyCODONE 5 MG immediate release tablet  Commonly known as:  Oxy IR/ROXICODONE  Take 5 mg by mouth every 4 (four) hours as needed for moderate pain.     pantoprazole 40 MG tablet  Commonly known as:  PROTONIX  Take 1 tablet (40 mg total) by mouth 2 (two) times daily before a meal.     PROCRIT IJ  Inject 7,000 Units as directed 3 (three) times a week. On dialysis days.     senna 8.6 MG Tabs tablet  Commonly known as:  SENOKOT  Take 2 tablets by mouth daily.      sevelamer carbonate 800 MG tablet  Commonly known as:  RENVELA  Take 800-2,400 mg by mouth 3 (three) times daily with meals. 3 tablets daily with a meal and 1 tablet with snacks.        Patients skin is clean, dry and intact, no evidence of skin break down. IV site discontinued and catheter remains intact. Site without signs and symptoms of complications. Dressing and pressure applied.  Patient received wheelchair from Cody Simon prior to discharge. Patient escorted to car by Lindsi, RN in a wheelchair,  no distress noted upon discharge.  Cody Simon 08/18/2014 5:42 PM

## 2014-08-18 NOTE — Procedures (Signed)
   HEMODIALYSIS TREATMENT NOTE:  4 hour heparin-free dialysis session completed via right upper arm AVF.  Bruising and hematoma unchanged from admission.  16g needles used (antegrade) and Qb 325.  Tolerated removal of 3.4 liters.  All blood reinfused.  Hemostasis achieved within 12 minutes.  Report called to Ophelia Shoulder, RN.  Rockwell [place], RN, CDN

## 2014-08-18 NOTE — Discharge Summary (Signed)
Physician Discharge Summary  Cody Simon IPJ:[id number] DOB: 1981/03/02 DOA: 08/16/2014  PCP: No PCP Per Patient  Admit date: 08/16/2014 Discharge date: 08/18/2014  Time spent: 40 minutes  Recommendations for Outpatient Follow-up:  1. Dr Nona Dell office will contact with results of biopsy and to schedule appointment for further cirrhosis workup  Discharge Diagnoses:  Principal Problem:   Hematemesis Active Problems:   ESRD (end stage renal disease)   Hyperkalemia   Anemia   Nausea & vomiting   Other cirrhosis of liver   Hypertension   Brain injury   Cirrhosis   Hematemesis with nausea   Gastritis and gastroduodenitis   Discharge Condition: stable  Diet recommendation: renal  Filed Weights   08/17/14 0657 08/18/14 0546 08/18/14 1010  Weight: 87.8 kg (193 lb 9 oz) 89.3 kg (196 lb 13.9 oz) 89.2 kg (196 lb 10.4 oz)    History of present illness:  34 yo male with hx of lupus, ESRD on HD (M, W, F) c/o n/v, ? Blood, and c/o knot on the right arm in Ed on 08/16/14. The n/v started when during dialysis 2 weeks ago. ? Blood in emesis and therefore his mother brought him to the ED for evaluation. Pt denied cp, palp, sob, abd pain, diarrhea, brbpr, Armstrong Creasy stool. Pt was noted to have high potassium and treated with calclium gluconate in Ed, and was admitted for n/v, ? Hematemesis, and hyperkalemia.  Hospital Course:  Hematemesis: nocuturnal nausea/vomitinig with possible hematemesis. Hg remained stable during hospitalization. Only one episode of nocturnal emesis on first night. No NSAIDS or aspirin. No reported melena or BRBPR.  Evaluated by GI who recommended EGD which revealed esophagitis and mild gastritis/duodentitis. Biopsies from esophagus negative for yeast. Gastric biopsies to be followed by GI.  Continue protonix  Hyperkalemia: resolved. Note from dialysis RN indicates right upper arm AVF recently cannulated first time and smaller gauge needle and slower blood flow rates  without extension of treatment time could contribute to hyperkalemia. Per her note this was discussed with OP dialysis center.  Compliant with dialysis. Mother reports compliance with diet. Had dialysis on day of discharge per regular schedule. monitor.    Anemia: stable see #1   ESRD (end stage renal disease): dialysis on day of discharge per regular schedule. Marland Kitchen Appreciated nephrology assistance. New fistula upper right arm with some swelling and heat. Non tender. +briut.     Nausea & vomiting: resolved    Hypertension: fair control.   Cirrhosis: appears stable at baseline. Evaluated by GI who recommend OP work up. They will call to schedule  Procedures:  Dialysis 08/16/14  EGD 2/23/16HEMATEMESIS DUE TO ESOPHAGITIS MILD GASTRITIS/DUODENITIS  Dialysis 08/18/14  Consultations:  Nephrology  GI  Discharge Exam: Danley Danker Vitals:   08/18/14 1130  BP: 170/70  Pulse: 95  Temp:   Resp:     General: well nourished no acute distress Cardiovascular: RRR no MGR No LE edema Respiratory: normal effort BS clear bilaterally no wheeze  Discharge Instructions    Current Discharge Medication List    START taking these medications   Details  pantoprazole (PROTONIX) 40 MG tablet Take 1 tablet (40 mg total) by mouth 2 (two) times daily before a meal. Qty: 60 tablet, Refills: 0      CONTINUE these medications which have NOT CHANGED   Details  amLODipine (NORVASC) 10 MG tablet Take 10 mg by mouth daily.    B Complex-C (B-COMPLEX WITH VITAMIN C) tablet Take 1 tablet by mouth daily.  carvedilol (COREG) 3.125 MG tablet Take 3.125 mg by mouth 2 (two) times daily with a meal.    cinacalcet (SENSIPAR) 60 MG tablet Take 60 mg by mouth daily.    clindamycin (CLEOCIN T) 1 % external solution Apply 1 application topically 2 (two) times daily.    docusate sodium (COLACE) 100 MG capsule Take 100 mg by mouth 2 (two) times daily.    Epoetin Alfa (PROCRIT IJ) Inject 7,000 Units as  directed 3 (three) times a week. On dialysis days.    escitalopram (LEXAPRO) 10 MG tablet Take 10 mg by mouth daily.    Lacosamide 100 MG TABS Take 1 tablet by mouth 2 (two) times daily.    !! levETIRAcetam (KEPPRA) 250 MG tablet Take 250 mg by mouth 3 (three) times a week. On Monday, Wednesday, and Friday. Should take in the evening/afternoon after HD.    !! levETIRAcetam (KEPPRA) 500 MG tablet Take 500 mg by mouth at bedtime.    olanzapine zydis (ZYPREXA) 15 MG disintegrating tablet Take 15 mg by mouth at bedtime.    oxyCODONE (OXY IR/ROXICODONE) 5 MG immediate release tablet Take 5 mg by mouth every 4 (four) hours as needed for moderate pain.    senna (SENOKOT) 8.6 MG TABS tablet Take 2 tablets by mouth daily.    sevelamer carbonate (RENVELA) 800 MG tablet Take 800-2,400 mg by mouth 3 (three) times daily with meals. 3 tablets daily with a meal and 1 tablet with snacks.     !! - Potential duplicate medications found. Please discuss with provider.    STOP taking these medications     clindamycin (CLINDAGEL) 1 % gel        Allergies  Allergen Reactions  . Dronabinol     n/v  . Lisinopril     Cough   . Other     murinol   Follow-up Information    Follow up with Barney Drain, MD.   Specialty:  Gastroenterology   Why:  office will contact with biopsy report and to schedule appointment for further workup of liver   Contact information:   [facility] [address] [place] [address] [phone]        The results of significant diagnostics from this hospitalization (including imaging, microbiology, ancillary and laboratory) are listed below for reference.    Significant Diagnostic Studies: Dg Chest 2 View  08/12/2014   CLINICAL DATA:  Pt c/o weakness and vomiting x 1 wk. Pt stopped urinating 2 days ago. Last BM 2 days ago. Last dialysis treatment yesterday. Hx renal disorder, HTN, Lupus, stroke, brain tumor, cardiac surgery, nonsmoker  EXAM:  CHEST  2 VIEW  COMPARISON:  None.  FINDINGS: There has been a previous median sternotomy. Cardiac silhouette is normal in size and configuration. Normal mediastinal and hilar contours. Vascular stent projects over the top of the aortic knob.  Clear lungs.  No pleural effusion or pneumothorax.  Bony thorax is unremarkable.  IMPRESSION: No acute cardiopulmonary disease.   Electronically Signed   By: Lajean Manes M.D.   On: 08/12/2014 15:03    Microbiology: Recent Results (from the past 240 hour(s))  MRSA PCR Screening     Status: None   Collection Time: 08/16/14  6:35 AM  Result Value Ref Range Status   MRSA by PCR NEGATIVE NEGATIVE Final    Comment:        The GeneXpert MRSA Assay (FDA approved for NASAL specimens only), is one component of a  comprehensive MRSA colonization surveillance program. It is not intended to diagnose MRSA infection nor to guide or monitor treatment for MRSA infections.   KOH prep     Status: None   Collection Time: 08/17/14  1:05 PM  Result Value Ref Range Status   Specimen Description ESOPHAGUS BRUSHING  Final   Special Requests NONE  Final   KOH Prep   Final    NO YEAST OR FUNGAL ELEMENTS SEEN Performed at [facility]    Report Status 08/17/2014 FINAL  Final     Labs: Basic Metabolic Panel:  Recent Labs Lab 08/12/14 1236 08/13/14 1300 08/16/14 0406 08/16/14 0917 08/17/14 0628 08/18/14 0711  NA 138  --  138 136 136 138  K 6.4* 5.2* 6.4* 6.6* 4.7 5.4*  CL 96  --  105 105 96 97  CO2 27  --  23 21 28 27   GLUCOSE 104*  --  90 81 83 81  BUN 53*  --  79* 81* 42* 62*  CREATININE 10.62*  --  14.29* 14.38* 9.87* 12.43*  CALCIUM 9.0  --  7.5* 7.4* 7.7* 7.9*  PHOS  --   --   --   --   --  6.8*   Liver Function Tests:  Recent Labs Lab 08/12/14 1257 08/16/14 0917  AST 46* 26  ALT 78* 52  ALKPHOS 285* 222*  BILITOT 0.6 0.6  PROT 8.8* 7.6  ALBUMIN 3.9 3.4*    Recent Labs Lab 08/12/14 1257  LIPASE 65*   No results for  input(s): AMMONIA in the last 168 hours. CBC:  Recent Labs Lab 08/12/14 1236 08/16/14 0406 08/16/14 0917 08/16/14 1102 08/16/14 2019 08/17/14 0628 08/18/14 0711  WBC 8.8 8.6 7.2  --  6.0 5.7 7.0  NEUTROABS 5.5 6.1  --   --   --   --   --   HGB 13.9 11.4* 10.2* 9.9* 10.8* 11.1* 10.3*  HCT 42.3 35.1* 31.9* 31.2* 33.4* 34.2* 32.7*  MCV 102.9* 101.2* 101.6*  --  101.2* 101.5* 101.9*  PLT 172 143* 126*  --  109* 127* 129*   Cardiac Enzymes: No results for input(s): CKTOTAL, CKMB, CKMBINDEX, TROPONINI in the last 168 hours. BNP: BNP (last 3 results) No results for input(s): BNP in the last 8760 hours.  ProBNP (last 3 results) No results for input(s): PROBNP in the last 8760 hours.  CBG: No results for input(s): GLUCAP in the last 168 hours.     SignedRadene Gunning  [facility] 08/18/2014, 11:46 AM

## 2014-08-18 NOTE — Progress Notes (Signed)
Cody Simon  MRN: [id number]  DOB/AGE: 09-26-1980 34 y.o.  Primary Care Physician:No PCP Per Patient  Admit date: 08/16/2014  Chief Complaint:  Chief Complaint  Patient presents with  . Hematemesis    Simon-Pt presented on  08/16/2014 with  Chief Complaint  Patient presents with  . Hematemesis  .    Pt today feels better.     Pt sen on Dialysis.Pt tolerating tx well.  Meds . amLODipine  10 mg Oral Daily  . carvedilol  3.125 mg Oral BID WC  . cinacalcet  60 mg Oral Q breakfast  . docusate sodium  100 mg Oral BID  . escitalopram  10 mg Oral Daily  . lacosamide  100 mg Oral BID  . levETIRAcetam  250 mg Oral Once per day on Mon Wed Fri  . levETIRAcetam  500 mg Oral QHS  . multivitamin  1 tablet Oral Daily  . OLANZapine zydis  15 mg Oral QHS  . pantoprazole  40 mg Oral BID AC  . senna  2 tablet Oral Daily  . sevelamer carbonate  2,400 mg Oral TID WC  . sodium chloride  3 mL Intravenous Q12H       Physical Exam: Vital signs in last 24 hours: Temp:  [97.6 F (36.4 C)-99.1 F (37.3 C)] 99.1 F (37.3 C) (02/24 0546) Pulse Rate:  [80-97] 88 (02/24 0546) Resp:  [9-43] 20 (02/24 0546) BP: (73-129)/(40-77) 127/59 mmHg (02/24 0546) SpO2:  [92 %-100 %] 92 % (02/24 0546) Weight:  [196 lb 13.9 oz (89.3 kg)] 196 lb 13.9 oz (89.3 kg) (02/24 0546) Weight change: 4 lb 13.6 oz (2.2 kg) Last BM Date: 08/16/14  Intake/Output from previous day: 02/23 0701 - 02/24 0700 In: 440 [P.O.:240; I.V.:200] Out: 0      Physical Exam: General- pt is awake,alert, oriented to time place and person Resp- No acute REsp distress, CTA B/L NO Rhonchi CVS- S1S2 regular ij rate and rhythm GIT- BS+, soft, NT, ND EXT- NO LE Edema, Cyanosis Access- Right AVF +   Lab Results: CBC  Recent Labs  08/17/14 0628 08/18/14 0711  WBC 5.7 7.0  HGB 11.1* 10.3*  HCT 34.2* 32.7*  PLT 127* 129*    BMET  Recent Labs  08/17/14 0628 08/18/14 0711  NA 136 138  K 4.7 5.4*  CL 96 97  CO2 28 27   GLUCOSE 83 81  BUN 42* 62*  CREATININE 9.87* 12.43*  CALCIUM 7.7* 7.9*    MICRO Recent Results (from the past 240 hour(Simon))  MRSA PCR Screening     Status: None   Collection Time: 08/16/14  6:35 AM  Result Value Ref Range Status   MRSA by PCR NEGATIVE NEGATIVE Final    Comment:        The GeneXpert MRSA Assay (FDA approved for NASAL specimens only), is one component of a comprehensive MRSA colonization surveillance program. It is not intended to diagnose MRSA infection nor to guide or monitor treatment for MRSA infections.   KOH prep     Status: None   Collection Time: 08/17/14  1:05 PM  Result Value Ref Range Status   Specimen Description ESOPHAGUS BRUSHING  Final   Special Requests NONE  Final   KOH Prep   Final    NO YEAST OR FUNGAL ELEMENTS SEEN Performed at [facility]    Report Status 08/17/2014 FINAL  Final      Lab Results  Component Value Date   CALCIUM 7.9* 08/18/2014  PHOS 6.8* 08/18/2014        Impression: 1)Renal ESRD on HD               On MWF schedule               Pt to be dialyzed today  2)HTN    Medication-  On Calcium Channel Blockers On Alpha and beta Blockers   3)Anemia HGb at goal (9--11)   4)CKD Mineral-Bone Disorder Secondary Hyperparathyroidism present     On sensipar Phosphorus on binders  5)Hyperlipidemia .  Ldl at goal PMD following  6)Electrooytes Hyperkalemic  sec to ESRD NOrmonatremic   7)Acid base Co2 at goal  8) GI- Admitted with possible Up GI bleed  Simon/p EGD  EGD showed Esophagitis   Plan:  Will continue current care.     Cody Simon 08/18/2014, 9:02 AM

## 2014-08-18 NOTE — Progress Notes (Signed)
Subjective:  Mom states he is still "choking" on his food and liquids. No vomiting since admission. No melena, brbpr. Denies abdominal pain.   Objective: Vital signs in last 24 hours: Temp:  [97.6 F (36.4 C)-99.1 F (37.3 C)] 99.1 F (37.3 C) (02/24 0546) Pulse Rate:  [80-97] 88 (02/24 0546) Resp:  [9-43] 20 (02/24 0546) BP: (73-129)/(40-77) 127/59 mmHg (02/24 0546) SpO2:  [92 %-100 %] 92 % (02/24 0546) Weight:  [196 lb 13.9 oz (89.3 kg)] 196 lb 13.9 oz (89.3 kg) (02/24 0546) Last BM Date: 08/16/14 General:   Alert,  Well-developed, well-nourished, pleasant and cooperative in NAD. Mom at bedside. Head:  Normocephalic and atraumatic. Eyes:  Sclera clear, no icterus.   Abdomen:  Soft, nontender and nondistended.  Extremities:  Without clubbing, deformity or edema. Neurologic:  Alert and  oriented x4;  grossly normal neurologically. Skin:  Intact without significant lesions or rashes. Psych:  Alert and cooperative. Normal mood and affect.  Intake/Output from previous day: 02/23 0701 - 02/24 0700 In: 440 [P.O.:240; I.V.:200] Out: 0  Intake/Output this shift:    Lab Results: CBC  Recent Labs  08/16/14 2019 08/17/14 0628 08/18/14 0711  WBC 6.0 5.7 7.0  HGB 10.8* 11.1* 10.3*  HCT 33.4* 34.2* 32.7*  MCV 101.2* 101.5* 101.9*  PLT 109* 127* 129*   BMET  Recent Labs  08/16/14 0406 08/16/14 0917 08/17/14 0628  NA 138 136 136  K 6.4* 6.6* 4.7  CL 105 105 96  CO2 23 21 28   GLUCOSE 90 81 83  BUN 79* 81* 42*  CREATININE 14.29* 14.38* 9.87*  CALCIUM 7.5* 7.4* 7.7*   LFTs  Recent Labs  08/16/14 0917  BILITOT 0.6  ALKPHOS 222*  AST 26  ALT 52  PROT 7.6  ALBUMIN 3.4*   No results for input(s): LIPASE in the last 72 hours. PT/INR  Recent Labs  08/16/14 0406  LABPROT 12.8  INR 0.95      Imaging Studies: Dg Chest 2 View  08/12/2014   CLINICAL DATA:  Pt c/o weakness and vomiting x 1 wk. Pt stopped urinating 2 days ago. Last BM 2 days ago. Last dialysis  treatment yesterday. Hx renal disorder, HTN, Lupus, stroke, brain tumor, cardiac surgery, nonsmoker  EXAM: CHEST  2 VIEW  COMPARISON:  None.  FINDINGS: There has been a previous median sternotomy. Cardiac silhouette is normal in size and configuration. Normal mediastinal and hilar contours. Vascular stent projects over the top of the aortic knob.  Clear lungs.  No pleural effusion or pneumothorax.  Bony thorax is unremarkable.  IMPRESSION: No acute cardiopulmonary disease.   Electronically Signed   By: Lajean Manes M.D.   On: 08/12/2014 15:03  [2 weeks]   Assessment:  34 year old male admitted with several week history of nocturnal nausea/vomiting, with recent episode of possible hematemesis; he has had a 3 gram drop in Hgb noted over the past several days (from prior ED visit to current admission). Chronic GERD noted without outpatient PPI. As of note, likely cirrhosis noted on outpatient imaging through [place], with last imaging via ultrasound in 2014.     Cirrhosis: care as outpatient. LFTs mainly cholestatic pattern. Etiology unclear. Reviewed all records in care everywhere. Low +ANA titer at [facility]. Iron studies with no findings suggestive of hemochromatosis. Viral markers negative. AMA, smooth muscle Ab, Alpha-1 antitrypsin level all unremarkable. Further work-up as outpatient to exclude other etiologies. US abdomen needed as outpatient. Mother wants his GI care here locally.  EGD yesterday with  esophagitis. Mild gastritis/duodenitis. Brush biopsies obtained from esophagus were negative for yeast. Gastric biopsies pending. No esophageal stricture.   "choking" with meals. Current one week h/o uri and congestion. If persistent dysphagia after regular PPI therapy and once acute URI resolves could consider further work up.   Plan: 1. Continue PPI BID. 2. Await biopsy results. 3. Outpatient cirrhosis work-up.  Laureen Ochs. Bernarda Caffey [facility] [phone] 2/24/201610:17 AM     LOS: 2 days

## 2014-08-28 ENCOUNTER — Telehealth: Payer: Self-pay | Admitting: Gastroenterology

## 2014-08-28 NOTE — Telephone Encounter (Signed)
Please call pt'S MOTHER. His stomach Bx shows gastritis. HIS NAUSEA AND VOMITING ARE MOST LIKELY DUE TO ACID REFLUX, AND GASTRITIS. CONTINUE PROTONIX. TAKE 30 MINUTES PRIOR TO MEALS TWICE DAILY. FOLLOW A LOW FAT RENAL DIET. OPV W/ DR. DANCEL OR DR. Gala Romney IN 1-2 MOS E30 CIRRHOSIS, VOMITING.

## 2014-08-30 NOTE — Telephone Encounter (Signed)
ON RECALL LIST  °

## 2014-09-03 NOTE — Telephone Encounter (Signed)
Tried to call pt's mother, call would not go through.

## 2014-09-06 NOTE — Telephone Encounter (Signed)
Mother is aware of results  

## 2014-10-21 ENCOUNTER — Encounter: Payer: Self-pay | Admitting: Internal Medicine

## 2015-11-26 IMAGING — CR DG CHEST 2V
2 series · 2 of 2 positions shown · non-contrast
Comparison: None.

CLINICAL DATA: Pt c/o weakness and vomiting x 1 wk. Pt stopped
urinating 2 days ago. Last BM 2 days ago. Last dialysis treatment
yesterday. Hx renal disorder, HTN, Lupus, stroke, brain tumor,
cardiac surgery, nonsmoker

EXAM:
CHEST  2 VIEW

[view not recorded (1 of 2)]
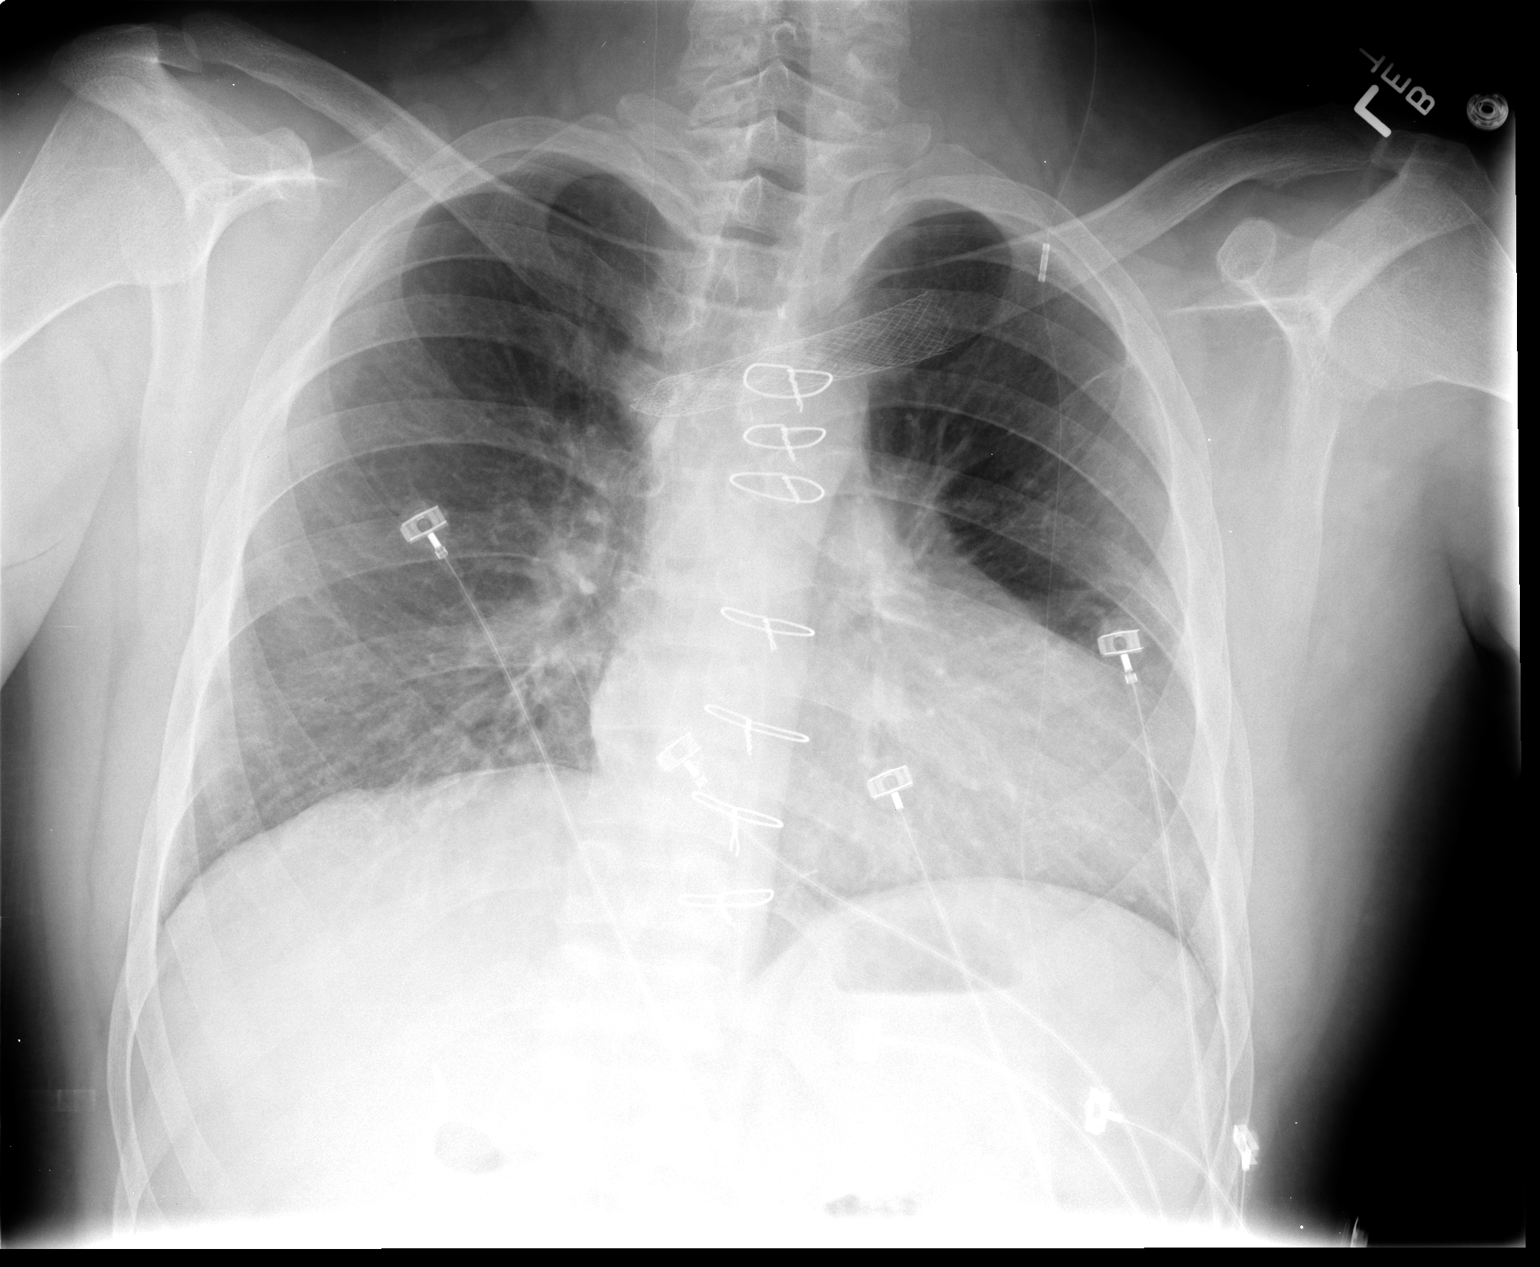

[view not recorded (2 of 2)]
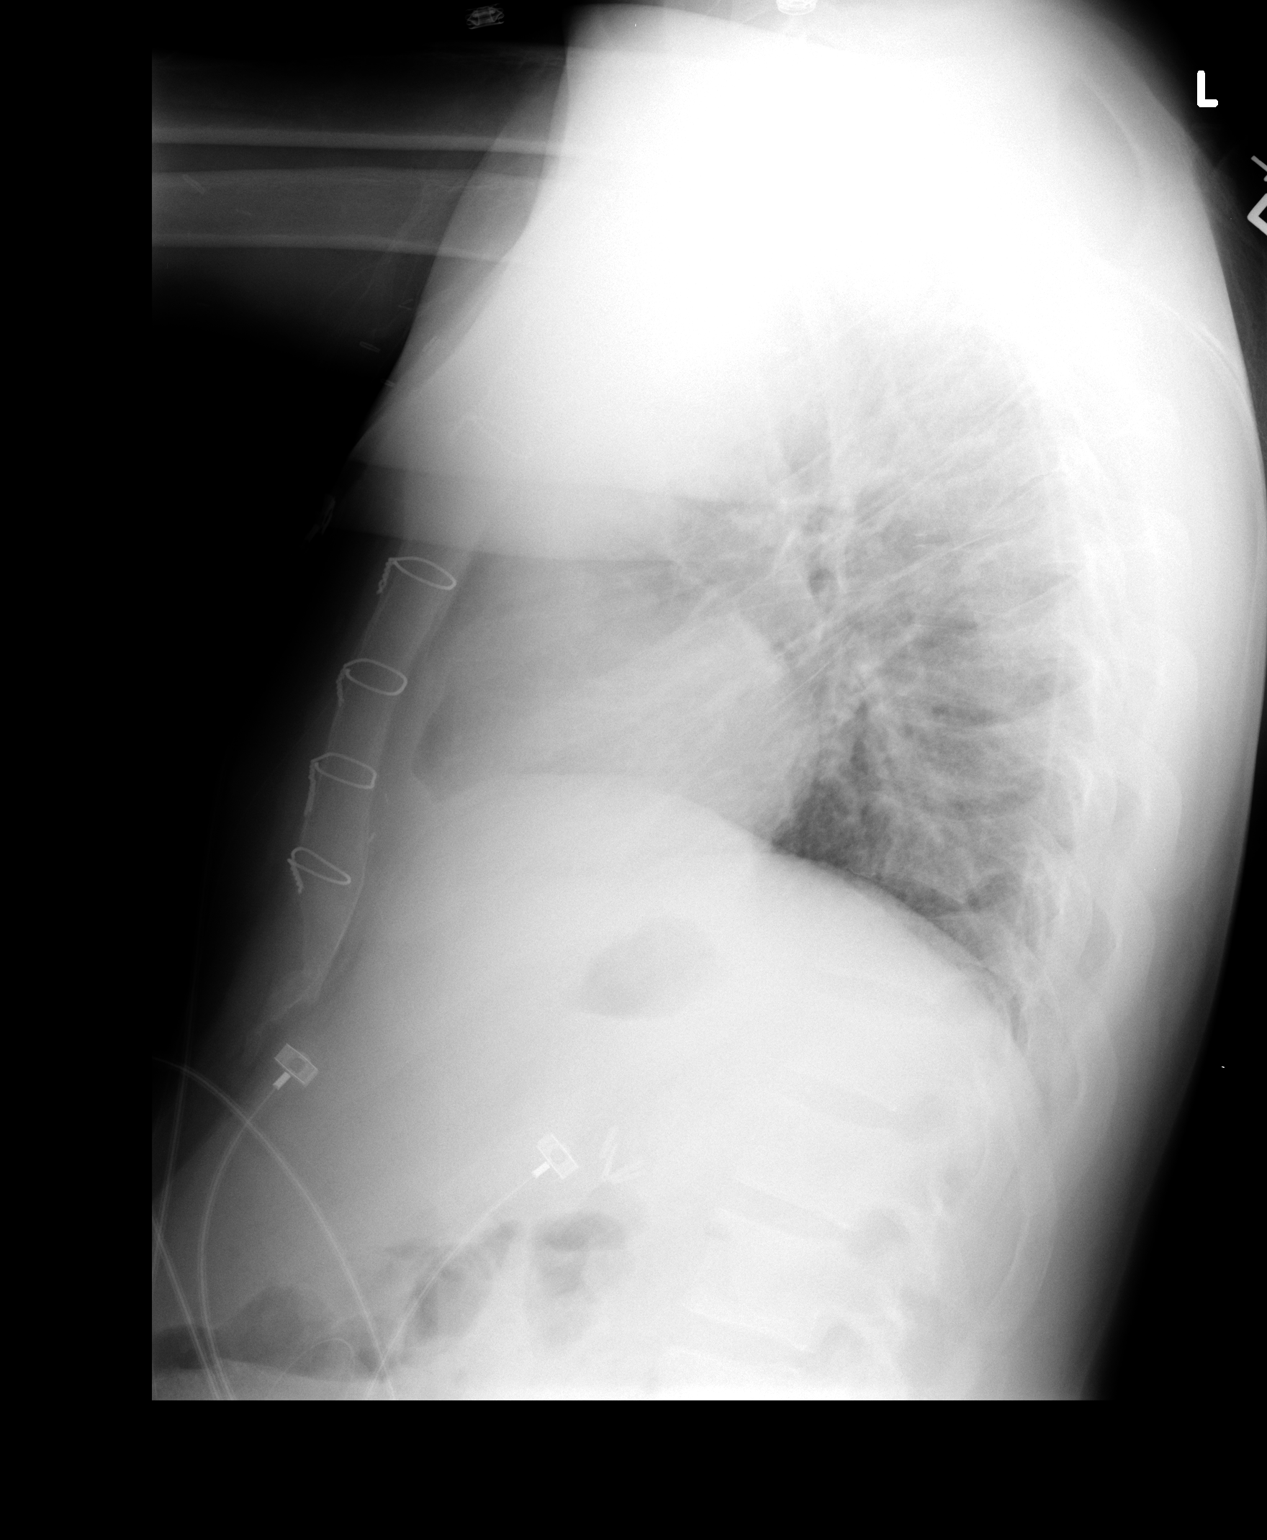

[2 of 2 positions shown; findings below may reference images not displayed]

FINDINGS: There has been a previous median sternotomy. Cardiac silhouette is
normal in size and configuration. Normal mediastinal and hilar
contours. Vascular stent projects over the top of the aortic knob.

Clear lungs.  No pleural effusion or pneumothorax.

Bony thorax is unremarkable.
IMPRESSION: No acute cardiopulmonary disease.

## 2018-07-26 DEATH — deceased
